# Patient Record
Sex: Female | Born: 1935 | Race: White | Hispanic: No | Smoking: Never smoker
Health system: Southern US, Community
[De-identification: ages and names within clinical notes are randomized; demographics above are authoritative.]

## PROBLEM LIST (undated history)

## (undated) DIAGNOSIS — N189 Chronic kidney disease, unspecified: Secondary | ICD-10-CM

## (undated) DIAGNOSIS — H3530 Unspecified macular degeneration: Secondary | ICD-10-CM

## (undated) DIAGNOSIS — R32 Unspecified urinary incontinence: Secondary | ICD-10-CM

## (undated) DIAGNOSIS — E119 Type 2 diabetes mellitus without complications: Secondary | ICD-10-CM

## (undated) DIAGNOSIS — E039 Hypothyroidism, unspecified: Secondary | ICD-10-CM

## (undated) DIAGNOSIS — E785 Hyperlipidemia, unspecified: Secondary | ICD-10-CM

## (undated) DIAGNOSIS — F0390 Unspecified dementia without behavioral disturbance: Secondary | ICD-10-CM

## (undated) DIAGNOSIS — H409 Unspecified glaucoma: Secondary | ICD-10-CM

## (undated) DIAGNOSIS — G4733 Obstructive sleep apnea (adult) (pediatric): Secondary | ICD-10-CM

## (undated) DIAGNOSIS — I10 Essential (primary) hypertension: Secondary | ICD-10-CM

---

## 2004-09-04 ENCOUNTER — Ambulatory Visit: Payer: Self-pay | Admitting: Internal Medicine

## 2004-09-25 ENCOUNTER — Ambulatory Visit: Payer: Self-pay | Admitting: Unknown Physician Specialty

## 2004-10-02 ENCOUNTER — Ambulatory Visit: Payer: Self-pay | Admitting: Unknown Physician Specialty

## 2005-10-01 ENCOUNTER — Ambulatory Visit: Payer: Self-pay | Admitting: Internal Medicine

## 2005-12-04 ENCOUNTER — Ambulatory Visit: Payer: Self-pay | Admitting: Internal Medicine

## 2006-01-14 ENCOUNTER — Ambulatory Visit: Payer: Self-pay | Admitting: Unknown Physician Specialty

## 2006-03-03 ENCOUNTER — Other Ambulatory Visit: Payer: Self-pay

## 2006-03-03 ENCOUNTER — Ambulatory Visit: Payer: Self-pay | Admitting: Unknown Physician Specialty

## 2006-03-06 ENCOUNTER — Inpatient Hospital Stay: Payer: Self-pay | Admitting: Unknown Physician Specialty

## 2006-04-16 ENCOUNTER — Ambulatory Visit: Payer: Self-pay | Admitting: Unknown Physician Specialty

## 2006-05-19 ENCOUNTER — Ambulatory Visit: Payer: Self-pay | Admitting: Unknown Physician Specialty

## 2006-05-22 ENCOUNTER — Ambulatory Visit: Payer: Self-pay | Admitting: Unknown Physician Specialty

## 2007-01-15 ENCOUNTER — Ambulatory Visit: Payer: Self-pay | Admitting: Internal Medicine

## 2008-03-09 ENCOUNTER — Ambulatory Visit: Payer: Self-pay | Admitting: Internal Medicine

## 2008-10-11 ENCOUNTER — Ambulatory Visit: Payer: Self-pay | Admitting: Gastroenterology

## 2008-11-22 ENCOUNTER — Ambulatory Visit: Payer: Self-pay | Admitting: Neurology

## 2008-11-25 ENCOUNTER — Encounter: Payer: Self-pay | Admitting: Neurology

## 2008-12-12 ENCOUNTER — Ambulatory Visit: Payer: Self-pay | Admitting: Neurology

## 2008-12-19 ENCOUNTER — Encounter: Payer: Self-pay | Admitting: Neurology

## 2009-01-12 ENCOUNTER — Ambulatory Visit: Payer: Self-pay | Admitting: Neurology

## 2009-01-16 ENCOUNTER — Encounter: Payer: Self-pay | Admitting: Neurology

## 2009-01-31 ENCOUNTER — Ambulatory Visit: Payer: Self-pay | Admitting: Ophthalmology

## 2009-02-13 ENCOUNTER — Ambulatory Visit: Payer: Self-pay | Admitting: Ophthalmology

## 2009-02-16 ENCOUNTER — Encounter: Payer: Self-pay | Admitting: Neurology

## 2009-03-18 ENCOUNTER — Encounter: Payer: Self-pay | Admitting: Neurology

## 2009-04-20 ENCOUNTER — Ambulatory Visit: Payer: Self-pay | Admitting: Internal Medicine

## 2009-05-02 ENCOUNTER — Ambulatory Visit: Payer: Self-pay | Admitting: Neurology

## 2009-07-14 ENCOUNTER — Ambulatory Visit: Payer: Self-pay | Admitting: Internal Medicine

## 2010-01-15 ENCOUNTER — Encounter: Payer: Self-pay | Admitting: Neurology

## 2010-01-16 ENCOUNTER — Encounter: Payer: Self-pay | Admitting: Neurology

## 2010-02-16 ENCOUNTER — Encounter: Payer: Self-pay | Admitting: Neurology

## 2010-03-18 ENCOUNTER — Encounter: Payer: Self-pay | Admitting: Neurology

## 2010-05-03 ENCOUNTER — Observation Stay: Payer: Self-pay | Admitting: Internal Medicine

## 2013-12-21 ENCOUNTER — Ambulatory Visit: Payer: Self-pay | Admitting: Family

## 2014-07-14 ENCOUNTER — Emergency Department: Payer: Self-pay | Admitting: Emergency Medicine

## 2015-06-24 ENCOUNTER — Emergency Department: Payer: Medicare (Managed Care)

## 2015-06-24 ENCOUNTER — Encounter: Payer: Self-pay | Admitting: Emergency Medicine

## 2015-06-24 ENCOUNTER — Emergency Department
Admission: EM | Admit: 2015-06-24 | Discharge: 2015-06-24 | Disposition: A | Discharge status: Home / Self Care | Payer: Medicare (Managed Care) | Attending: Emergency Medicine | Admitting: Emergency Medicine

## 2015-06-24 DIAGNOSIS — S0990XA Unspecified injury of head, initial encounter: Secondary | ICD-10-CM | POA: Diagnosis present

## 2015-06-24 DIAGNOSIS — N189 Chronic kidney disease, unspecified: Secondary | ICD-10-CM | POA: Diagnosis not present

## 2015-06-24 DIAGNOSIS — Y9301 Activity, walking, marching and hiking: Secondary | ICD-10-CM | POA: Diagnosis not present

## 2015-06-24 DIAGNOSIS — Z880 Allergy status to penicillin: Secondary | ICD-10-CM | POA: Diagnosis not present

## 2015-06-24 DIAGNOSIS — S0181XA Laceration without foreign body of other part of head, initial encounter: Secondary | ICD-10-CM | POA: Insufficient documentation

## 2015-06-24 DIAGNOSIS — E119 Type 2 diabetes mellitus without complications: Secondary | ICD-10-CM | POA: Insufficient documentation

## 2015-06-24 DIAGNOSIS — S0191XA Laceration without foreign body of unspecified part of head, initial encounter: Secondary | ICD-10-CM

## 2015-06-24 DIAGNOSIS — Y92128 Other place in nursing home as the place of occurrence of the external cause: Secondary | ICD-10-CM | POA: Diagnosis not present

## 2015-06-24 DIAGNOSIS — I129 Hypertensive chronic kidney disease with stage 1 through stage 4 chronic kidney disease, or unspecified chronic kidney disease: Secondary | ICD-10-CM | POA: Insufficient documentation

## 2015-06-24 DIAGNOSIS — W1839XA Other fall on same level, initial encounter: Secondary | ICD-10-CM | POA: Diagnosis not present

## 2015-06-24 DIAGNOSIS — Y998 Other external cause status: Secondary | ICD-10-CM | POA: Insufficient documentation

## 2015-06-24 HISTORY — DX: Chronic kidney disease, unspecified: N18.9

## 2015-06-24 HISTORY — DX: Unspecified macular degeneration: H35.30

## 2015-06-24 HISTORY — DX: Hyperlipidemia, unspecified: E78.5

## 2015-06-24 HISTORY — DX: Essential (primary) hypertension: I10

## 2015-06-24 HISTORY — DX: Unspecified glaucoma: H40.9

## 2015-06-24 HISTORY — DX: Hypothyroidism, unspecified: E03.9

## 2015-06-24 HISTORY — DX: Unspecified dementia, unspecified severity, without behavioral disturbance, psychotic disturbance, mood disturbance, and anxiety: F03.90

## 2015-06-24 HISTORY — DX: Obstructive sleep apnea (adult) (pediatric): G47.33

## 2015-06-24 HISTORY — DX: Type 2 diabetes mellitus without complications: E11.9

## 2015-06-24 HISTORY — DX: Unspecified urinary incontinence: R32

## 2015-06-24 MED ORDER — LIDOCAINE HCL (PF) 1 % IJ SOLN
2.0000 mL | Freq: Once | INTRAMUSCULAR | Status: AC
  Administered 2015-06-24: 2 mL via INTRADERMAL
  Filled 2015-06-24: qty 5

## 2015-06-24 NOTE — Discharge Instructions (Signed)
Head Injury You have a head injury. Headaches and throwing up (vomiting) are common after a head injury. It should be easy to wake up from sleeping. Sometimes you must stay in the hospital. Most problems happen within the first 24 hours. Side effects may occur up to 7-10 days after the injury.  WHAT ARE THE TYPES OF HEAD INJURIES? Head injuries can be as minor as a bump. Some head injuries can be more severe. More severe head injuries include:  A jarring injury to the brain (concussion).  A bruise of the brain (contusion). This mean there is bleeding in the brain that can cause swelling.  A cracked skull (skull fracture).  Bleeding in the brain that collects, clots, and forms a bump (hematoma). WHEN SHOULD I GET HELP RIGHT AWAY?   You are confused or sleepy.  You cannot be woken up.  You feel sick to your stomach (nauseous) or keep throwing up (vomiting).  Your dizziness or unsteadiness is getting worse.  You have very bad, lasting headaches that are not helped by medicine. Take medicines only as told by your doctor.  You cannot use your arms or legs like normal.  You cannot walk.  You notice changes in the black spots in the center of the colored part of your eye (pupil).  You have clear or bloody fluid coming from your nose or ears.  You have trouble seeing. During the next 24 hours after the injury, you must stay with someone who can watch you. This person should get help right away (call 911 in the [place] if you start to shake and are not able to control it (have seizures), you pass out, or you are unable to wake up. HOW CAN I PREVENT A HEAD INJURY IN THE FUTURE?  Wear seat belts.  Wear a helmet while bike riding and playing sports like football.  Stay away from dangerous activities around the house. WHEN CAN I RETURN TO NORMAL ACTIVITIES AND ATHLETICS? See your doctor before doing these activities. You should not do normal activities or play contact sports until 1 week  after the following symptoms have stopped:  Headache that does not go away.  Dizziness.  Poor attention.  Confusion.  Memory problems.  Sickness to your stomach or throwing up.  Tiredness.  Fussiness.  Bothered by bright lights or loud noises.  Anxiousness or depression.  Restless sleep. MAKE SURE YOU:   Understand these instructions.  Will watch your condition.  Will get help right away if you are not doing well or get worse. Document Released: 10/17/2008 Document Revised: 03/21/2014 Document Reviewed: 07/12/2013 [facility] Patient Information 2015 [place], [place]. This information is not intended to replace advice given to you by your health care provider. Make sure you discuss any questions you have with your health care provider.  Sutured Wound Care Sutures are stitches that can be used to close wounds. Wound care helps prevent pain and infection.  HOME CARE INSTRUCTIONS   Rest and elevate the injured area until all the pain and swelling are gone.  Only take over-the-counter or prescription medicines for pain, discomfort, or fever as directed by your caregiver.  After 48 hours, gently wash the area with mild soap and water once a day, or as directed. Rinse off the soap. Pat the area dry with a clean towel. Do not rub the wound. This may cause bleeding.  Follow your caregiver's instructions for how often to change the bandage (dressing). Stop using a dressing after 2 days or  after the wound stops draining.  If the dressing sticks, moisten it with soapy water and gently remove it.  Apply ointment on the wound as directed.  Avoid stretching a sutured wound.  Drink enough fluids to keep your urine clear or pale yellow.  Follow up with your caregiver for suture removal as directed.  Use sunscreen on your wound for the next 3 to 6 months so the scar will not darken. SEEK IMMEDIATE MEDICAL CARE IF:   Your wound becomes red, swollen, hot, or tender.  You have  increasing pain in the wound.  You have a red streak that extends from the wound.  There is pus coming from the wound.  You have a fever.  You have shaking chills.  There is a bad smell coming from the wound.  You have persistent bleeding from the wound. MAKE SURE YOU:   Understand these instructions.  Will watch your condition.  Will get help right away if you are not doing well or get worse. Document Released: 12/12/2004 Document Revised: 01/27/2012 Document Reviewed: 03/10/2011 [facility] Patient Information 2015 [place], [place]. This information is not intended to replace advice given to you by your health care provider. Make sure you discuss any questions you have with your health care provider.

## 2015-06-24 NOTE — ED Notes (Signed)
Discharge instructions given to Daughter Lelon Perla.  Patient taken back to [facility] by daughter via personal vehicle.

## 2015-06-24 NOTE — ED Provider Notes (Signed)
[facility] Emergency Department Provider Note  ____________________________________________  Time seen: ----------------------------------------- 3:03 PM on 06/24/2015 -----------------------------------------    I have reviewed the triage vital signs and the nursing notes.   HISTORY  Chief Complaint Fall   HPI Cassandra Fuentes is a 81 y.o. female who had a non-syncopal fall today. Patient was apparently ambulating in the hallway and fell forward after losing her balance. She states that she's having some left hand pain but otherwise no physical complaints. She does have family history of dementia and seems to be at her baseline mental status she denies any neck thoracic or lumbar spine pain. She describes some mild pain in her left hand. He denies any lower extremity or pelvic discomfort. According to the nursing staff that her last tetanus shot is April 2012.    Past Medical History  Diagnosis Date  . Diabetes mellitus without complication   . Hypertension   . Hypothyroidism   . Hypothyroidism   . Urinary incontinence   . Obstructive sleep apnea   . Chronic kidney disease   . Macular degeneration   . Glaucoma   . Hyperlipidemia   . Dementia     There are no active problems to display for this patient.   History reviewed. No pertinent past surgical history.  No current outpatient prescriptions on file.  Allergies Penicillins; Telepaque; and Morphine and related  History reviewed. No pertinent family history.  Social History History  Substance Use Topics  . Smoking status: Never Smoker   . Smokeless tobacco: Not on file  . Alcohol Use: No    Review of Systems  Patient denies any other musculoskeletal complaints of left hand pain. Patient denies any near-syncope symptoms. She denies any chest pain or shortness of breath. She denies any abdominal pain   ____________________________________________   PHYSICAL EXAM:  VITAL  SIGNS: ED Triage Vitals  Enc Vitals Group     BP 06/24/15 1430 121/56 mmHg     Pulse Rate 06/24/15 1430 65     Resp 06/24/15 1435 16     Temp 06/24/15 1435 97.6 F (36.4 C)     Temp Source 06/24/15 1435 Oral     SpO2 06/24/15 1429 92 %     Weight 06/24/15 1435 175 lb (79.379 kg)     Height 06/24/15 1435  (1.676 m)     Head Cir --      Peak Flow --      Pain Score --      Pain Loc --      Pain Edu? --      Excl. in GC? --      Constitutional: Alert and oriented. Well appearing and in no distress. Eyes: Conjunctivae are normal.  ENT   Head: Patient has a 3 cm laceration above the left eyebrow. Palpation here shows no crepitus or step-off noted and appears to be in the skin and slightly into subcutaneous area. No foreign bodies present.   Mouth/Throat: Mucous membranes are moist. Neck: No central C-spine tenderness with good flexion extension rotation of pain or neuropraxia Cardiovascular: Normal rate, regular rhythm. Normal and symmetric distal pulses are present in all extremities. No murmurs, rubs, or gallops. Respiratory: Normal respiratory effort without tachypnea nor retractions. Breath sounds are clear and equal bilaterally.  Gastrointestinal: Soft and non-tender in all quadrants. No distention. There is no CVA tenderness. Genitourinary: deferred Musculoskeletal: Nontender with normal range of motion in all extremities. No lower extremity tenderness nor edema. Close  examination of the left hand shows no musculoskeletal abnormalities. Normal alignment of the digits with no swelling or ecchymosis. Mild skin tear on the lateral surface of her left wrist non-hemorrhagic. Neurologic:  Normal speech and language. No gross focal neurologic deficits are appreciated. Skin:  No contusions or abrasions over one mentioned above. Psychiatric: Mood and affect are normal. Patient exhibits appropriate insight and judgment.  ____________________________________________    LABS  (pertinent positives/negatives)  Labs Reviewed - No data to display  ____________________________________________   EKG none    ____________________________________________    RADIOLOGY I have personally reviewed any xrays that were ordered on this patient:   _____________________   Anice Paganini #[id number] (81 y.o. F) ED15A        Lab Results    None    Imaging Results       DG Hand 2 View Left (Final result) Result time: 06/24/15 16:26:49   Final result by Rad Results In Interface (06/24/15 16:26:49)   Narrative:   CLINICAL DATA: Patient status post fall. Left hand pain. Dementia. Initial encounter.  EXAM: LEFT HAND - 2 VIEW  COMPARISON: None.  FINDINGS: Only an AP and oblique view of the hand was submitted, limiting evaluation. [facility] joint degenerative changes. DIP joint degenerative changes particularly within the middle finger. No evidence for acute displaced fracture. Regional soft tissues unremarkable.  IMPRESSION: Limited exam as only an AP and oblique view were obtained.  No definite displaced fracture.   Electronically Signed By: Annia Belt M.D. On: 06/24/2015 16:26          CT Head Wo Contrast (Final result) Result time: 06/24/15 15:07:35   Final result by Rad Results In Interface (06/24/15 15:07:35)   Narrative:   CLINICAL DATA: Patient tripped and fell from standing position at nursing home, hit head, no loss of consciousness, laceration outer left thigh  EXAM: CT HEAD WITHOUT CONTRAST  TECHNIQUE: Contiguous axial images were obtained from the base of the skull through the vertex without intravenous contrast.  COMPARISON: 07/14/2014  FINDINGS: Severe atrophy. No intracranial hemorrhage or fluid. No evidence of infarct or mass. No skull fracture. Small chronic lacunar infarct left pons stable. Or prominent extra-axial space over the right vertex than on the left, stable, with low-attenuation.  Possibility of chronic hygroma not excluded, but no interval change. Laceration over left orbit noted.  IMPRESSION: Severe chronic involutional change with no acute intracranial abnormalities   Electronically Signed By: Esperanza Heir M.D. On: 06/24/2015 15:07        ECG Results    None     _______________________  IMPRESSION:  Left hand x-ray Head CT  Severe chronic involutional change with no acute intracranial abnormalities   PROCEDURES  Procedure(s) performed: LACERATION REPAIR Performed by: Jennye Moccasin Authorized by: Jennye Moccasin Consent: Verbal consent obtained. Risks and benefits: risks, benefits and alternatives were discussed Consent given by: patient Patient identity confirmed: provided demographic data Prepped and Draped in normal sterile fashion Wound explored  Laceration Location: Left frontal  face  Laceration Length: 3 cm non-complicated  No Foreign Bodies seen or palpated  Anesthesia: local infiltration  Local anesthetic: lidocaine 1 % without epinephrine  Anesthetic total: 4 ml  Irrigation method: syringe Amount of cleaning: standard  Skin closure: Single-layer   Number of sutures: 4  Technique: Interrupted   Patient tolerance: Patient tolerated the procedure well with no immediate complications.    ____________________________________________   INITIAL IMPRESSION / ASSESSMENT AND PLAN / ED COURSE  Pertinent  labs & imaging results that were available during my care of the patient were reviewed by me and considered in my medical decision making (see chart for details).  Patient had a non-syncopal fall and had a negative head CT and x-rays of the left hand. Patient's stay here was otherwise uneventful. Her wound on her head was cleaned and dressed by the nursing staff. To his recurrent 5-7 days.  ____________________________________________   FINAL CLINICAL IMPRESSION(S) / ED DIAGNOSES  Assessment: Facial  laceration Suture repair Left hand contusion Status post non-syncopal fall  Plan: Discharge back to facility. Patient can go by private vehicle Final diagnoses:  None     Jennye Moccasin, MD 06/24/15 1650

## 2015-06-24 NOTE — ED Notes (Signed)
Patient in via EMS for fall at [facility].  Patient walking with walker, tripped and  fell from standing position.  Staff report patient fell face first and hit head. Staff denies LOC.

## 2017-07-07 ENCOUNTER — Emergency Department: Payer: Medicare (Managed Care)

## 2017-07-07 ENCOUNTER — Inpatient Hospital Stay
Admission: EM | Admit: 2017-07-07 | Discharge: 2017-07-12 | DRG: 064 | Disposition: A | Discharge status: Hospice | Payer: Medicare (Managed Care) | Attending: Internal Medicine | Admitting: Internal Medicine

## 2017-07-07 DIAGNOSIS — G92 Toxic encephalopathy: Secondary | ICD-10-CM | POA: Diagnosis present

## 2017-07-07 DIAGNOSIS — R4182 Altered mental status, unspecified: Secondary | ICD-10-CM | POA: Diagnosis present

## 2017-07-07 DIAGNOSIS — Z7189 Other specified counseling: Secondary | ICD-10-CM

## 2017-07-07 DIAGNOSIS — E039 Hypothyroidism, unspecified: Secondary | ICD-10-CM | POA: Diagnosis present

## 2017-07-07 DIAGNOSIS — L89892 Pressure ulcer of other site, stage 2: Secondary | ICD-10-CM | POA: Diagnosis present

## 2017-07-07 DIAGNOSIS — N3000 Acute cystitis without hematuria: Secondary | ICD-10-CM | POA: Diagnosis not present

## 2017-07-07 DIAGNOSIS — B9620 Unspecified Escherichia coli [E. coli] as the cause of diseases classified elsewhere: Secondary | ICD-10-CM | POA: Diagnosis present

## 2017-07-07 DIAGNOSIS — K56609 Unspecified intestinal obstruction, unspecified as to partial versus complete obstruction: Secondary | ICD-10-CM

## 2017-07-07 DIAGNOSIS — W19XXXA Unspecified fall, initial encounter: Secondary | ICD-10-CM | POA: Diagnosis present

## 2017-07-07 DIAGNOSIS — R1310 Dysphagia, unspecified: Secondary | ICD-10-CM | POA: Diagnosis present

## 2017-07-07 DIAGNOSIS — Z515 Encounter for palliative care: Secondary | ICD-10-CM

## 2017-07-07 DIAGNOSIS — E785 Hyperlipidemia, unspecified: Secondary | ICD-10-CM | POA: Diagnosis present

## 2017-07-07 DIAGNOSIS — I639 Cerebral infarction, unspecified: Secondary | ICD-10-CM | POA: Diagnosis not present

## 2017-07-07 DIAGNOSIS — H409 Unspecified glaucoma: Secondary | ICD-10-CM | POA: Diagnosis present

## 2017-07-07 DIAGNOSIS — G4733 Obstructive sleep apnea (adult) (pediatric): Secondary | ICD-10-CM | POA: Diagnosis present

## 2017-07-07 DIAGNOSIS — Z91041 Radiographic dye allergy status: Secondary | ICD-10-CM

## 2017-07-07 DIAGNOSIS — G309 Alzheimer's disease, unspecified: Secondary | ICD-10-CM | POA: Diagnosis present

## 2017-07-07 DIAGNOSIS — H3530 Unspecified macular degeneration: Secondary | ICD-10-CM | POA: Diagnosis present

## 2017-07-07 DIAGNOSIS — E1122 Type 2 diabetes mellitus with diabetic chronic kidney disease: Secondary | ICD-10-CM | POA: Diagnosis present

## 2017-07-07 DIAGNOSIS — Z79899 Other long term (current) drug therapy: Secondary | ICD-10-CM

## 2017-07-07 DIAGNOSIS — Z1623 Resistance to quinolones and fluoroquinolones: Secondary | ICD-10-CM | POA: Diagnosis present

## 2017-07-07 DIAGNOSIS — Z885 Allergy status to narcotic agent status: Secondary | ICD-10-CM

## 2017-07-07 DIAGNOSIS — I129 Hypertensive chronic kidney disease with stage 1 through stage 4 chronic kidney disease, or unspecified chronic kidney disease: Secondary | ICD-10-CM | POA: Diagnosis present

## 2017-07-07 DIAGNOSIS — N183 Chronic kidney disease, stage 3 (moderate): Secondary | ICD-10-CM | POA: Diagnosis present

## 2017-07-07 DIAGNOSIS — Z1612 Extended spectrum beta lactamase (ESBL) resistance: Secondary | ICD-10-CM | POA: Diagnosis present

## 2017-07-07 DIAGNOSIS — F0280 Dementia in other diseases classified elsewhere without behavioral disturbance: Secondary | ICD-10-CM | POA: Diagnosis present

## 2017-07-07 DIAGNOSIS — R29725 NIHSS score 25: Secondary | ICD-10-CM | POA: Diagnosis present

## 2017-07-07 DIAGNOSIS — Z66 Do not resuscitate: Secondary | ICD-10-CM | POA: Diagnosis present

## 2017-07-07 DIAGNOSIS — Z880 Allergy status to penicillin: Secondary | ICD-10-CM

## 2017-07-07 DIAGNOSIS — F329 Major depressive disorder, single episode, unspecified: Secondary | ICD-10-CM | POA: Diagnosis present

## 2017-07-07 LAB — CBC WITH DIFFERENTIAL/PLATELET
Basophils Absolute: 0.1 10*3/uL (ref 0–0.1)
Basophils Relative: 1 %
EOS ABS: 0.3 10*3/uL (ref 0–0.7)
EOS PCT: 2 %
HCT: 37.5 % (ref 35.0–47.0)
Hemoglobin: 12.4 g/dL (ref 12.0–16.0)
LYMPHS ABS: 2.5 10*3/uL (ref 1.0–3.6)
LYMPHS PCT: 22 %
MCH: 29.5 pg (ref 26.0–34.0)
MCHC: 33 g/dL (ref 32.0–36.0)
MCV: 89.4 fL (ref 80.0–100.0)
MONO ABS: 1.5 10*3/uL — AB (ref 0.2–0.9)
MONOS PCT: 14 %
Neutro Abs: 6.7 10*3/uL — ABNORMAL HIGH (ref 1.4–6.5)
Neutrophils Relative %: 61 %
PLATELETS: 355 10*3/uL (ref 150–440)
RBC: 4.2 MIL/uL (ref 3.80–5.20)
RDW: 12.9 % (ref 11.5–14.5)
WBC: 11.1 10*3/uL — AB (ref 3.6–11.0)

## 2017-07-07 LAB — URINALYSIS, COMPLETE (UACMP) WITH MICROSCOPIC
BILIRUBIN URINE: NEGATIVE
GLUCOSE, UA: NEGATIVE mg/dL
HGB URINE DIPSTICK: NEGATIVE
Ketones, ur: NEGATIVE mg/dL
NITRITE: POSITIVE — AB
Protein, ur: NEGATIVE mg/dL
SPECIFIC GRAVITY, URINE: 1.019 (ref 1.005–1.030)
pH: 6 (ref 5.0–8.0)

## 2017-07-07 LAB — COMPREHENSIVE METABOLIC PANEL
ALK PHOS: 78 U/L (ref 38–126)
ALT: 13 U/L — AB (ref 14–54)
AST: 20 U/L (ref 15–41)
Albumin: 3.6 g/dL (ref 3.5–5.0)
Anion gap: 10 (ref 5–15)
BUN: 18 mg/dL (ref 6–20)
CALCIUM: 9.2 mg/dL (ref 8.9–10.3)
CHLORIDE: 100 mmol/L — AB (ref 101–111)
CO2: 31 mmol/L (ref 22–32)
CREATININE: 0.77 mg/dL (ref 0.44–1.00)
GFR calc non Af Amer: >60 mL/min (ref >60)
Glucose, Bld: 179 mg/dL — ABNORMAL HIGH (ref 65–99)
Potassium: 3.6 mmol/L (ref 3.5–5.1)
SODIUM: 141 mmol/L (ref 135–145)
Total Bilirubin: 0.5 mg/dL (ref 0.3–1.2)
Total Protein: 7.2 g/dL (ref 6.5–8.1)

## 2017-07-07 LAB — APTT: aPTT: 28 seconds (ref 24–36)

## 2017-07-07 LAB — TROPONIN I

## 2017-07-07 MED ORDER — SODIUM CHLORIDE 0.9 % IV BOLUS (SEPSIS): 1000.0000 mL | Freq: Once | INTRAVENOUS | Status: DC

## 2017-07-07 NOTE — ED Triage Notes (Signed)
Pt arrives to ED via [facility] from [facility] for excessive drooling. Doctor at [facility] told EMS stroke scale negative. Pt is able to answer yes/no questions. Speech is garbled. Able to move arms but not legs. Smiles, raises eyes. Pupils equil and reactive.

## 2017-07-07 NOTE — ED Provider Notes (Signed)
[facility] Emergency Department Provider Note  ____________________________________________   8:31 PM  I have reviewed the triage vital signs and the nursing notes.   HISTORY  Chief Complaint Drooling   HPI Cassandra Fuentes is a 83 y.o. female who presents to the emergency department for evaluationof excessive drooling. Patient was last observed to be normal during lunch around 12:00 this afternoon. Staff at [facility] then noted her to have garbled speech which is not her baseline. Daughter states that she fell about 10 days ago and was evaluated by the Medical Director who took x-rays of her left leg and hip that were negative for fracture. Daughter questions sciatica since the fall and she has had difficulty getting up and down and getting on and off the toilet and has been less willing to ambulate with her walker.    Past Medical History:  Diagnosis Date  . Chronic kidney disease   . Dementia   . Diabetes mellitus without complication (HCC)   . Glaucoma   . Hyperlipidemia   . Hypertension   . Hypothyroidism   . Hypothyroidism   . Macular degeneration   . Obstructive sleep apnea   . Urinary incontinence     There are no active problems to display for this patient.   History reviewed. No pertinent surgical history.  Prior to Admission medications   Medication Sig Start Date End Date Taking? Authorizing Provider  acetaminophen (TYLENOL) 325 MG tablet Take 325 mg by mouth every Monday, Wednesday, and Friday. Give at [facility] on center days at 12p.   Yes [provider]  acetaminophen (TYLENOL) 500 MG tablet Take 500 mg by mouth every 4 (four) hours as needed for mild pain, fever or headache.   Yes [provider]  acetaminophen (TYLENOL) 650 MG CR tablet Take 1,300 mg by mouth 2 (two) times daily.   Yes [provider]  Alum & Mag Hydroxide-Simeth (GERI-LANTA PO) Take 30 mLs by mouth every 6 (six) hours as needed (for  heartburn/indigestion). Not to exceed 4 doses in 24 Hours.   Yes [provider]  brimonidine (ALPHAGAN) 0.15 % ophthalmic solution Place 1 drop into both eyes 2 (two) times daily.   Yes [provider]  Calcium Carbonate-Vitamin D (CALCIUM 600+D) 600-400 MG-UNIT per tablet Take 1 tablet by mouth every morning.   Yes [provider]  clopidogrel (PLAVIX) 75 MG tablet Take 75 mg by mouth daily.   Yes [provider]  enalapril (VASOTEC) 20 MG tablet Take 20 mg by mouth 2 (two) times daily.   Yes [provider]  guaiFENesin (Q-TUSSIN) 100 MG/5ML liquid Take 200 mg by mouth every 6 (six) hours as needed for cough. Not to exceed 4 doses in 24 hours.   Yes [provider]  levothyroxine (SYNTHROID, LEVOTHROID) 150 MCG tablet Take 150 mcg by mouth daily before breakfast.   Yes [provider]  loperamide (IMODIUM) 2 MG capsule Take 2 mg by mouth every 3 (three) hours as needed for diarrhea or loose stools. Not to exceed 8 doses in 24 hours.   Yes [provider]  magnesium hydroxide (MILK OF MAGNESIA) 400 MG/5ML suspension Take 30 mLs by mouth at bedtime as needed for mild constipation or moderate constipation.   Yes [provider]  Melatonin 5 MG TABS Take 5 mg by mouth at bedtime.   Yes [provider]  mometasone (NASONEX) 50 MCG/ACT nasal spray Place 1 spray into the nose daily.  Yes [provider]  multivitamin-lutein (OCUVITE-LUTEIN) CAPS capsule Take 1 capsule by mouth daily.   Yes [provider]  PARoxetine (PAXIL) 10 MG tablet Take 10 mg by mouth daily.   Yes [provider]  simvastatin (ZOCOR) 40 MG tablet Take 40 mg by mouth every evening.   Yes [provider]  Trolamine Salicylate (ASPERCREME) 10 % LOTN Apply 1 application topically 2 (two) times daily. Apply to right shoulder and right knee.   Yes [provider]    Allergies Penicillins; Telepaque  [iopanoic acid]; and Morphine and related  History reviewed. No pertinent family history.  Social History Social History  Substance Use Topics  . Smoking status: Never Smoker  . Smokeless tobacco: Not on file  . Alcohol use No    Review of Systems impossible due to patient's level of dementia and current illness. ____________________________________________   PHYSICAL EXAM:  VITAL SIGNS: ED Triage Vitals [07/07/17 1934]  Enc Vitals Group     BP 126/85     Pulse Rate 62     Resp (!) 22     Temp      Temp src      SpO2 100 %     Weight      Height      Head Circumference      Peak Flow      Pain Score 0     Pain Loc      Pain Edu?      Excl. in GC?     Constitutional: Alert and oriented. Well appearing and in no acute distress. Eyes: Conjunctivae are normal. PERRL. Pupils 2+ and sluggish Head: Atraumatic. Nose: No congestion/rhinnorhea. Mouth/Throat: Mucous membranes are moist.  Oropharynx non-erythematous. Neck: No stridor. No cervical spine tenderness to palpation. Cardiovascular: Normal rate, regular rhythm. Grossly normal heart sounds.  Good peripheral circulation. Respiratory: Normal respiratory effort.  No retractions. Lungs CTAB. Gastrointestinal: Soft and nontender. No distention. No abdominal bruits. No CVA tenderness. Musculoskeletal: No lower extremity tenderness nor edema.  No joint effusions. Neurologic:  Normal speech and language. No gross focal neurologic deficits are appreciated. No gait instability. Skin:  Skin is warm, dry and intact. No rash noted. Psychiatric: Mood and affect are normal. Speech and behavior are normal.  ____________________________________________   LABS (all labs ordered are listed, but only abnormal results are displayed)  Labs Reviewed  URINALYSIS, COMPLETE (UACMP) WITH MICROSCOPIC - Abnormal; Notable for the following:       Result Value   Color, Urine YELLOW (*)    APPearance HAZY (*)    Nitrite POSITIVE (*)     Leukocytes, UA MODERATE (*)    Bacteria, UA MANY (*)    Squamous Epithelial / LPF 6-30 (*)    All other components within normal limits  COMPREHENSIVE METABOLIC PANEL - Abnormal; Notable for the following:    Chloride 100 (*)    Glucose, Bld 179 (*)    ALT 13 (*)    All other components within normal limits  CBC WITH DIFFERENTIAL/PLATELET - Abnormal; Notable for the following:    WBC 11.1 (*)    Neutro Abs 6.7 (*)    Monocytes Absolute 1.5 (*)    All other components within normal limits  URINE CULTURE  APTT  TROPONIN I  CBG MONITORING, ED   ____________________________________________  EKG   Sinus tachycardia with a rate of 113. Occasional PVCs. Normal PR and QT intervals. Right axis deviation. ____________________________________________  RADIOLOGY  Ct Head Code Stroke  Wo Contrast  Result Date: 07/07/2017 CLINICAL DATA:  Code stroke. 83 y/o F; garbled speech. Unable to move legs. EXAM: CT HEAD WITHOUT CONTRAST TECHNIQUE: Contiguous axial images were obtained from the base of the skull through the vertex without intravenous contrast. COMPARISON:  06/24/2015 CT head FINDINGS: Brain: No evidence of acute infarction, hemorrhage, hydrocephalus, extra-axial collection or mass lesion/mass effect. Stable advanced chronic microvascular ischemic changes and parenchymal volume loss of the brain. Stable chronic lacunar infarcts within left caudate head, left lentiform nucleus, and left hemi pons. Vascular: Severe calcific atherosclerosis of carotid siphons and calcified plaques of the M1 segments. 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 Skull: Normal. Negative for fracture or focal lesion.The Sinuses/Orbits: No acute finding. Other: None. ASPECTS [facility] Stroke Program Early CT Score) - Ganglionic level infarction (caudate, lentiform nuclei, internal capsule, insula, M1-M3 cortex): 7 - Supraganglionic infarction (M4-M6 cortex): 3 Total score (0-10 with 10 being normal): 10 IMPRESSION: 1. No  acute intracranial abnormality identified. 2. Stable advanced chronic microvascular ischemic changes and parenchymal volume loss of the brain. 3. ASPECTS is 10 These results were called by telephone at the time of interpretation on 07/07/2017 at 8:11 pm to Dr. Roxan Hockey, who verbally acknowledged these results. Electronically Signed   By: Mitzi Hansen M.D.   On: 07/07/2017 20:12    ____________________________________________   PROCEDURES  Procedure(s) performed: None  Procedures  Critical Care performed: No  ____________________________________________   INITIAL IMPRESSION / ASSESSMENT AND PLAN / ED COURSE  Pertinent labs & imaging results that were available during my care of the patient were reviewed by me and considered in my medical decision making (see chart for details).  83 year old female presenting to the emergency department for evaluation after staff at [facility] house and noticed that she was drooling and that her speech was slurred.   ----------------------------------------- 8:31 PM on 07/07/2017 -----------------------------------------  Daughter updated on labs and CT scan. Awaiting remainder of lab results and urinalysis.  ----------------------------------------- 11:08 PM on 07/07/2017 -----------------------------------------  Patient unable to complete the stroke swallow screen. No improvement in symptoms, however no worsening or focal change on repeat neurological examination. Stroke scoring systems are of no help due to patient's baseline dementia as well as fall 10 days ago that has left her unable to ambulate even with her walker. She has been very resistant to any degree of using her left leg despite reportedly negative images performed after her fall. Plan of admission was discussed with patient's family to agree to have her stay for further evaluation. __________________________________________   FINAL CLINICAL IMPRESSION(S) / ED  DIAGNOSES  Final diagnoses:  Altered mental status, unspecified altered mental status type  Acute cystitis without hematuria      NEW MEDICATIONS STARTED DURING THIS VISIT:  New Prescriptions   No medications on file     Note:  This document was prepared using Dragon voice recognition software and may include unintentional dictation errors.    Chinita Pester, FNP 07/07/17 2311    Sharman Cheek, MD 07/07/17 [id number]

## 2017-07-07 NOTE — ED Notes (Signed)
Transported patient to CT.

## 2017-07-07 NOTE — H&P (Signed)
History and Physical   [facility] - [place] @ [facility] Admission History and Physical [employer], D.O.    Patient Name: Cassandra Fuentes MR#: [id number] Date of Birth: 1929-06-12 Date of Admission: 07/07/2017  Referring MD/NP/PA: NP Kem [place]s Primary Care Physician: [facility], [facility]  Chief Complaint:  Chief Complaint  Patient presents with  . Drooling  Please note the entire history is obtained from the patient's emergency department chart, emergency department provider and the patient's family who is at the bedside. Patient's personal history is limited by dementia and altered mental status.   HPI: Cassandra Fuentes is a 83 y.o. female with a known history of CK D, dementia, diabetes, hypertension, hyperlipidemia, hypothyroidism, obstructive sleep apnea presents to the emergency department for evaluation of altered mental status.  Patient was in a usual state of health until this afternoon when she was noted to have altered mental status with some drooling and slurred speech at her living facility..  Of note patient had a fall 10 days ago and has been unable to ambulate since then even with her walker.   EMS/ED Course: Patient received normal saline. Medical admission has been requested for further management of altered mental status, questionable CVA and urinary tract infection..  Review of Systems:  Unable to obtain secondary to dementia and altered mental status   Past Medical History:  Diagnosis Date  . Chronic kidney disease   . Dementia   . Diabetes mellitus without complication (HCC)   . Glaucoma   . Hyperlipidemia   . Hypertension   . Hypothyroidism   . Hypothyroidism   . Macular degeneration   . Obstructive sleep apnea   . Urinary incontinence     History reviewed. No pertinent surgical history.   reports that she has never smoked. She does not have any smokeless tobacco history on file. She reports that she does not drink alcohol or  use drugs.  Allergies  Allergen Reactions  . Penicillins Hives  . Telepaque [Iopanoic Acid] Other (See Comments)    Unknown reaction  . Morphine And Related Nausea And Vomiting    History reviewed. No pertinent family history.  Prior to Admission medications   Medication Sig Start Date End Date Taking? Authorizing Provider  acetaminophen (TYLENOL) 325 MG tablet Take 325 mg by mouth every Monday, Wednesday, and Friday. Give at [facility] on center days at 12p.    [provider]  acetaminophen (TYLENOL) 500 MG tablet Take 500 mg by mouth every 4 (four) hours as needed for mild pain, fever or headache.    [provider]  acetaminophen (TYLENOL) 650 MG CR tablet Take 1,300 mg by mouth 2 (two) times daily.    [provider]  Alum & Mag Hydroxide-Simeth (GERI-LANTA PO) Take 30 mLs by mouth every 6 (six) hours as needed (for heartburn/indigestion). Not to exceed 4 doses in 24 Hours.    [provider]  bimatoprost (LUMIGAN) 0.01 % SOLN Place 1 drop into both eyes at bedtime.    [provider]  brimonidine (ALPHAGAN) 0.15 % ophthalmic solution Place 1 drop into both eyes 2 (two) times daily.    [provider]  Calcium Carbonate-Vitamin D (CALCIUM 600+D) 600-400 MG-UNIT per tablet Take 1 tablet by mouth every morning.    [provider]  clopidogrel (PLAVIX) 75 MG tablet Take 75 mg by mouth daily.    [provider]  enalapril (VASOTEC) 20 MG tablet Take 20 mg by mouth 2 (two) times daily.  [provider]  guaiFENesin (Q-TUSSIN) 100 MG/5ML liquid Take 200 mg by mouth every 6 (six) hours as needed for cough. Not to exceed 4 doses in 24 hours.    [provider]  levothyroxine (SYNTHROID, LEVOTHROID) 150 MCG tablet Take 150 mcg by mouth daily before breakfast.    [provider]  loperamide (IMODIUM) 2 MG capsule Take 2 mg by mouth every 3 (three) hours as needed for diarrhea or loose stools. Not to  exceed 8 doses in 24 hours.    [provider]  magnesium hydroxide (MILK OF MAGNESIA) 400 MG/5ML suspension Take 30 mLs by mouth at bedtime as needed for mild constipation or moderate constipation.    [provider]  Melatonin 5 MG TABS Take 5 mg by mouth at bedtime.    [provider]  mometasone (NASONEX) 50 MCG/ACT nasal spray Place 1 spray into the nose daily.    [provider]  multivitamin-lutein (OCUVITE-LUTEIN) CAPS capsule Take 1 capsule by mouth daily.    [provider]  PARoxetine (PAXIL) 10 MG tablet Take 10 mg by mouth daily.    [provider]  simvastatin (ZOCOR) 40 MG tablet Take 40 mg by mouth every evening.    [provider]  Trolamine Salicylate (ASPERCREME) 10 % LOTN Apply 1 application topically 2 (two) times daily. Apply to right shoulder and right knee.    [provider]    Physical Exam: Vitals:   07/07/17 1930 07/07/17 1934 07/07/17 2133 07/07/17 2134  BP: 126/85 126/85 (!) 142/52   Pulse: (!) 56 62 (!) 58 (!) 38  Resp: 13 (!) 22 13 13   SpO2: 93% 100% (!) 87% 96%    GENERAL: 83 y.o.-year-old elderly female patient, chronically ill appearing.  HEENT: Head atraumatic, normocephalic. Pupils equal. Mucus membranes moist. Drooling.  NECK: Supple, full range of motion. No JVD, no bruit heard. No thyroid enlargement, no tenderness, no cervical lymphadenopathy. CHEST: Normal breath sounds bilaterally. No wheezing, rales, rhonchi or crackles. No use of accessory muscles of respiration.  No reproducible chest wall tenderness.  CARDIOVASCULAR: S1, S2 normal. No murmurs, rubs, or gallops. Cap refill <2 seconds. Pulses intact distally.  ABDOMEN: Soft, nondistended, nontender. No rebound, guarding, rigidity. Normoactive bowel sounds present in all four quadrants.  EXTREMITIES: No pedal edema, cyanosis, or clubbing. No calf tenderness or Homan's sign.  NEUROLOGIC: The patient is awake but not alert, does  not interact, respond or follow commands.  PSYCHIATRIC:  Normal affect, mood, thought content. SKIN: Warm, dry, and intact without obvious rash, lesion, or ulcer.    Labs on Admission:  CBC:  Recent Labs Lab 07/07/17 1932  WBC 11.1*  NEUTROABS 6.7*  HGB 12.4  HCT 37.5  MCV 89.4  PLT 355   Basic Metabolic Panel:  Recent Labs Lab 07/07/17 1932  NA 141  K 3.6  CL 100*  CO2 31  GLUCOSE 179*  BUN 18  CREATININE 0.77  CALCIUM 9.2   GFR: CrCl cannot be calculated (Unknown ideal weight.). Liver Function Tests:  Recent Labs Lab 07/07/17 1932  AST 20  ALT 13*  ALKPHOS 78  BILITOT 0.5  PROT 7.2  ALBUMIN 3.6   No results for input(s): LIPASE, AMYLASE in the last 168 hours. No results for input(s): AMMONIA in the last 168 hours. Coagulation Profile: No results for input(s): INR, PROTIME in the last 168 hours. Cardiac Enzymes:  Recent Labs Lab 07/07/17 1932  TROPONINI <0.03   BNP (last 3 results) No results  for input(s): PROBNP in the last 8760 hours. HbA1C: No results for input(s): HGBA1C in the last 72 hours. CBG: No results for input(s): GLUCAP in the last 168 hours. Lipid Profile: No results for input(s): CHOL, HDL, LDLCALC, TRIG, CHOLHDL, LDLDIRECT in the last 72 hours. Thyroid Function Tests: No results for input(s): TSH, T4TOTAL, FREET4, T3FREE, THYROIDAB in the last 72 hours. Anemia Panel: No results for input(s): VITAMINB12, FOLATE, FERRITIN, TIBC, IRON, RETICCTPCT in the last 72 hours. Urine analysis:    Component Value Date/Time   COLORURINE YELLOW (A) 07/07/2017 2105   APPEARANCEUR HAZY (A) 07/07/2017 2105   LABSPEC 1.019 07/07/2017 2105   PHURINE 6.0 07/07/2017 2105   GLUCOSEU NEGATIVE 07/07/2017 2105   HGBUR NEGATIVE 07/07/2017 2105   BILIRUBINUR NEGATIVE 07/07/2017 2105   KETONESUR NEGATIVE 07/07/2017 2105   PROTEINUR NEGATIVE 07/07/2017 2105   NITRITE POSITIVE (A) 07/07/2017 2105   LEUKOCYTESUR MODERATE (A) 07/07/2017 2105    Sepsis Labs: @LABRCNTIP (procalcitonin:4,lacticidven:4) )No results found for this or any previous visit (from the past 240 hour(s)).   Radiological Exams on Admission: Ct Head Code Stroke Wo Contrast  Result Date: 07/07/2017 CLINICAL DATA:  Code stroke. 83 y/o F; garbled speech. Unable to move legs. EXAM: CT HEAD WITHOUT CONTRAST TECHNIQUE: Contiguous axial images were obtained from the base of the skull through the vertex without intravenous contrast. COMPARISON:  06/24/2015 CT head FINDINGS: Brain: No evidence of acute infarction, hemorrhage, hydrocephalus, extra-axial collection or mass lesion/mass effect. Stable advanced chronic microvascular ischemic changes and parenchymal volume loss of the brain. Stable chronic lacunar infarcts within left caudate head, left lentiform nucleus, and left hemi pons. Vascular: Severe calcific atherosclerosis of carotid siphons and calcified plaques of the M1 segments. 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 Skull: Normal. Negative for fracture or focal lesion.The Sinuses/Orbits: No acute finding. Other: None. ASPECTS [facility] Stroke Program Early CT Score) - Ganglionic level infarction (caudate, lentiform nuclei, internal capsule, insula, M1-M3 cortex): 7 - Supraganglionic infarction (M4-M6 cortex): 3 Total score (0-10 with 10 being normal): 10 IMPRESSION: 1. No acute intracranial abnormality identified. 2. Stable advanced chronic microvascular ischemic changes and parenchymal volume loss of the brain. 3. ASPECTS is 10 These results were called by telephone at the time of interpretation on 07/07/2017 at 8:11 pm to Dr. Roxan Hockey, who verbally acknowledged these results. Electronically Signed   By: Mitzi Hansen M.D.   On: 07/07/2017 20:12    EKG: Sinus tachycardia at 113 bpm with rightward axis and nonspecific ST-T wave changes.   Assessment/Plan  This is a 83 y.o. female with a history of CKD, dementia, diabetes, hypertension, hyperlipidemia,  hypothyroidism, obstructive sleep apnea now being admitted with:  #. AMS, likely multifactorial; rule out CVA, advancing dementia, UTI. - Admit to stroke unit, telemetry observation for neuro workup including: - Labs: CBC, BMP, Lipids, TFTs, A1C - Nursing: Neurochecks, O2, dysphagia screen, permissive hypertension.  - Consults: Neurology, PT/OT, S/S consults.  - Meds: Daily aspirin 81mg .   - Fluids: IVNS@75cc /hr.   - Routine DVT Px: with Lovenox, SCDs, early ambulation - NPO hold all meds pending S/S eval - Family would like to defer MRI/MRA, but pursue echo/carotids  #. UTI - Initiate empiric therapy with rocephin - Follow up cultures  #. Immobility s/p fall - PT eval in AM - Pain control  #. History of CK D, chronic and stable -Monitor BMP  #. History of hypertension - Hold by mouth enalapril for now  #. History of hyperlipidemia - Hold by mouth Zocor for now  #.  History of dementia/depression - Hold by mouth Paxil for now  #. History of hypothyroidism - Hold by mouth Synthroid for now  #. H/o Diabetes - Accuchecks q4h with RISS coverage - Heart healthy, carb controlled diet  Admission status: Inpatient IV Fluids: Normal saline Diet/Nutrition: Nothing by mouth Consults called: Physical therapy and neurology  DVT Px: Lovenox, SCDs and early ambulation. Code Status: DNR Disposition Plan: To home in 1-2 days  All the records are reviewed and case discussed with ED provider. Management plans discussed with the patient and/or family who express understanding and agree with plan of care.  Cutler Sunday D.O. on 07/07/2017 at 10:56 PM Between 7am to 6pm - Pager - [phone] After 6pm go to [email or web address] - [employer] [place] [facility] Office [phone] CC: Primary care physician; [employer], [employer]   07/07/2017, 10:56 PM

## 2017-07-08 ENCOUNTER — Inpatient Hospital Stay: Payer: Medicare (Managed Care)

## 2017-07-08 DIAGNOSIS — Z7189 Other specified counseling: Secondary | ICD-10-CM | POA: Diagnosis not present

## 2017-07-08 DIAGNOSIS — Z79899 Other long term (current) drug therapy: Secondary | ICD-10-CM | POA: Diagnosis not present

## 2017-07-08 DIAGNOSIS — N183 Chronic kidney disease, stage 3 (moderate): Secondary | ICD-10-CM | POA: Diagnosis present

## 2017-07-08 DIAGNOSIS — W19XXXA Unspecified fall, initial encounter: Secondary | ICD-10-CM | POA: Diagnosis present

## 2017-07-08 DIAGNOSIS — E039 Hypothyroidism, unspecified: Secondary | ICD-10-CM | POA: Diagnosis present

## 2017-07-08 DIAGNOSIS — I639 Cerebral infarction, unspecified: Principal | ICD-10-CM

## 2017-07-08 DIAGNOSIS — I129 Hypertensive chronic kidney disease with stage 1 through stage 4 chronic kidney disease, or unspecified chronic kidney disease: Secondary | ICD-10-CM | POA: Diagnosis present

## 2017-07-08 DIAGNOSIS — Z885 Allergy status to narcotic agent status: Secondary | ICD-10-CM | POA: Diagnosis not present

## 2017-07-08 DIAGNOSIS — R4182 Altered mental status, unspecified: Secondary | ICD-10-CM | POA: Diagnosis not present

## 2017-07-08 DIAGNOSIS — Z515 Encounter for palliative care: Secondary | ICD-10-CM | POA: Diagnosis not present

## 2017-07-08 DIAGNOSIS — G308 Other Alzheimer's disease: Secondary | ICD-10-CM | POA: Diagnosis not present

## 2017-07-08 DIAGNOSIS — F0280 Dementia in other diseases classified elsewhere without behavioral disturbance: Secondary | ICD-10-CM | POA: Diagnosis not present

## 2017-07-08 DIAGNOSIS — G4733 Obstructive sleep apnea (adult) (pediatric): Secondary | ICD-10-CM | POA: Diagnosis present

## 2017-07-08 DIAGNOSIS — E785 Hyperlipidemia, unspecified: Secondary | ICD-10-CM | POA: Diagnosis present

## 2017-07-08 DIAGNOSIS — G92 Toxic encephalopathy: Secondary | ICD-10-CM | POA: Diagnosis present

## 2017-07-08 DIAGNOSIS — Z66 Do not resuscitate: Secondary | ICD-10-CM | POA: Diagnosis present

## 2017-07-08 DIAGNOSIS — F329 Major depressive disorder, single episode, unspecified: Secondary | ICD-10-CM | POA: Diagnosis present

## 2017-07-08 DIAGNOSIS — E1122 Type 2 diabetes mellitus with diabetic chronic kidney disease: Secondary | ICD-10-CM | POA: Diagnosis present

## 2017-07-08 DIAGNOSIS — H3530 Unspecified macular degeneration: Secondary | ICD-10-CM | POA: Diagnosis present

## 2017-07-08 DIAGNOSIS — Z880 Allergy status to penicillin: Secondary | ICD-10-CM | POA: Diagnosis not present

## 2017-07-08 DIAGNOSIS — N3000 Acute cystitis without hematuria: Secondary | ICD-10-CM | POA: Diagnosis not present

## 2017-07-08 DIAGNOSIS — H409 Unspecified glaucoma: Secondary | ICD-10-CM | POA: Diagnosis present

## 2017-07-08 DIAGNOSIS — Z91041 Radiographic dye allergy status: Secondary | ICD-10-CM | POA: Diagnosis not present

## 2017-07-08 LAB — CBC
HCT: 35.2 % (ref 35.0–47.0)
Hemoglobin: 11.6 g/dL — ABNORMAL LOW (ref 12.0–16.0)
MCH: 28.8 pg (ref 26.0–34.0)
MCHC: 33.1 g/dL (ref 32.0–36.0)
MCV: 86.9 fL (ref 80.0–100.0)
PLATELETS: 332 10*3/uL (ref 150–440)
RBC: 4.05 MIL/uL (ref 3.80–5.20)
RDW: 12.7 % (ref 11.5–14.5)
WBC: 12.7 10*3/uL — ABNORMAL HIGH (ref 3.6–11.0)

## 2017-07-08 LAB — GLUCOSE, CAPILLARY
GLUCOSE-CAPILLARY: 131 mg/dL — AB (ref 65–99)
GLUCOSE-CAPILLARY: 213 mg/dL — AB (ref 65–99)
Glucose-Capillary: 193 mg/dL — ABNORMAL HIGH (ref 65–99)
Glucose-Capillary: 173 mg/dL — ABNORMAL HIGH (ref 65–99)
Glucose-Capillary: 108 mg/dL — ABNORMAL HIGH (ref 65–99)
Glucose-Capillary: 126 mg/dL — ABNORMAL HIGH (ref 65–99)
Glucose-Capillary: 112 mg/dL — ABNORMAL HIGH (ref 65–99)

## 2017-07-08 LAB — CREATININE, SERUM
CREATININE: 0.84 mg/dL (ref 0.44–1.00)
GFR calc Af Amer: >60 mL/min (ref >60)

## 2017-07-08 LAB — TROPONIN I: Troponin I: <0.03 ng/mL (ref <0.03)

## 2017-07-08 LAB — MRSA PCR SCREENING: MRSA BY PCR: NEGATIVE

## 2017-07-08 MED ORDER — LEVOFLOXACIN IN D5W 750 MG/150ML IV SOLN: 750.0000 mg | INTRAVENOUS | Status: DC

## 2017-07-08 MED ORDER — TROLAMINE SALICYLATE 10 % EX LOTN: 1.0000 "application " | TOPICAL_LOTION | Freq: Two times a day (BID) | CUTANEOUS | Status: DC

## 2017-07-08 MED ORDER — FLUTICASONE PROPIONATE 50 MCG/ACT NA SUSP
1.0000 | Freq: Every day | NASAL | Status: DC
  Administered 2017-07-09 – 2017-07-11 (×3): 1 via NASAL
  Filled 2017-07-08: qty 16

## 2017-07-08 MED ORDER — SENNOSIDES-DOCUSATE SODIUM 8.6-50 MG PO TABS: 1.0000 | ORAL_TABLET | Freq: Every evening | ORAL | Status: DC | PRN

## 2017-07-08 MED ORDER — BRIMONIDINE TARTRATE 0.15 % OP SOLN
1.0000 [drp] | Freq: Two times a day (BID) | OPHTHALMIC | Status: DC
  Administered 2017-07-08 – 2017-07-11 (×6): 1 [drp] via OPHTHALMIC
  Filled 2017-07-08: qty 5

## 2017-07-08 MED ORDER — ASPIRIN 300 MG RE SUPP
300.0000 mg | Freq: Every day | RECTAL | Status: DC
  Administered 2017-07-08 – 2017-07-11 (×4): 300 mg via RECTAL
  Filled 2017-07-08 (×4): qty 1

## 2017-07-08 MED ORDER — SODIUM CHLORIDE 0.9 % IV SOLN
INTRAVENOUS | Status: DC
  Administered 2017-07-08 – 2017-07-10 (×4): via INTRAVENOUS

## 2017-07-08 MED ORDER — ACETAMINOPHEN 650 MG RE SUPP
650.0000 mg | RECTAL | Status: DC | PRN
  Administered 2017-07-08: 12:00:00 650 mg via RECTAL
  Filled 2017-07-08: qty 1

## 2017-07-08 MED ORDER — LEVOFLOXACIN IN D5W 750 MG/150ML IV SOLN
750.0000 mg | Freq: Once | INTRAVENOUS | Status: AC
  Administered 2017-07-08: 750 mg via INTRAVENOUS
  Filled 2017-07-08: qty 150

## 2017-07-08 MED ORDER — SODIUM CHLORIDE 0.9 % IV SOLN
1.0000 g | Freq: Two times a day (BID) | INTRAVENOUS | Status: DC
  Administered 2017-07-08 – 2017-07-11 (×7): 1 g via INTRAVENOUS
  Filled 2017-07-08 (×8): qty 1

## 2017-07-08 MED ORDER — ONDANSETRON HCL 4 MG/2ML IJ SOLN
4.0000 mg | Freq: Four times a day (QID) | INTRAMUSCULAR | Status: DC | PRN
  Administered 2017-07-08: 4 mg via INTRAVENOUS
  Filled 2017-07-08: qty 2

## 2017-07-08 MED ORDER — STROKE: EARLY STAGES OF RECOVERY BOOK
Freq: Once | Status: AC
  Administered 2017-07-08: 06:00:00

## 2017-07-08 MED ORDER — TROLAMINE SALICYLATE 10 % EX CREA
TOPICAL_CREAM | Freq: Two times a day (BID) | CUTANEOUS | Status: DC
  Administered 2017-07-10 – 2017-07-11 (×2): via TOPICAL
  Filled 2017-07-08: qty 85

## 2017-07-08 MED ORDER — INSULIN ASPART 100 UNIT/ML ~~LOC~~ SOLN
0.0000 [IU] | SUBCUTANEOUS | Status: DC
  Administered 2017-07-08: 06:00:00 5 [IU] via SUBCUTANEOUS
  Administered 2017-07-08: 12:00:00 3 [IU] via SUBCUTANEOUS
  Administered 2017-07-09 (×3): 2 [IU] via SUBCUTANEOUS
  Filled 2017-07-08 (×5): qty 1

## 2017-07-08 MED ORDER — ACETAMINOPHEN 325 MG PO TABS: 650.0000 mg | ORAL_TABLET | ORAL | Status: DC | PRN

## 2017-07-08 MED ORDER — ENOXAPARIN SODIUM 40 MG/0.4ML ~~LOC~~ SOLN
40.0000 mg | SUBCUTANEOUS | Status: DC
  Administered 2017-07-08 – 2017-07-10 (×3): 40 mg via SUBCUTANEOUS
  Filled 2017-07-08 (×3): qty 0.4

## 2017-07-08 MED ORDER — ACETAMINOPHEN 160 MG/5ML PO SOLN: 650.0000 mg | ORAL | Status: DC | PRN

## 2017-07-08 NOTE — Progress Notes (Addendum)
Pharmacy Antibiotic Note  BONETTA STIGERS is a 83 y.o. female admitted on 07/07/2017 with UTI.  Pharmacy was originally consulted for Levaquin dosing. Pharmacy now consulted for meropenem dosing. MD reports patients outpatient UCx showed ESBL E. Coli.   Plan: Will discontinue levofloxacin 750mg   Will start Meropenem 1g IV every 12 hours based on current CrCl <17ml/min. Will monitor renal function and adjust dose as needed.   Height: 5\' 3"  (160 cm) Weight: 180 lb 8 oz (81.9 kg) IBW/kg (Calculated) : 52.4  Temp (24hrs), Avg:98.7 F (37.1 C), Min:97.5 F (36.4 C), Max:100.6 F (38.1 C)   Recent Labs Lab 07/07/17 1932 07/08/17 0312  WBC 11.1* 12.7*  CREATININE 0.77 0.84    Estimated Creatinine Clearance: 48.7 mL/min (by C-G formula based on SCr of 0.84 mg/dL).    Allergies  Allergen Reactions  . Penicillins Hives  . Telepaque [Iopanoic Acid] Other (See Comments)    Unknown reaction  . Morphine And Related Nausea And Vomiting    Antimicrobials this admission: Levaquin 8/21 >> 8/21 Meropenem 8/21 >>   Dose adjustments this admission:   Microbiology results: 8/20 UCx: pending   8/20 [id number]: many  LE(+) NO2(+) WBC TNTC  Thank you for allowing pharmacy to be a part of this patient's care.  Gardner Candle, PharmD, BCPS Clinical Pharmacist 07/08/2017 1:25 PM

## 2017-07-08 NOTE — Progress Notes (Signed)
PT Cancellation Note  Patient Details Name: Cassandra Fuentes MRN: [id number] DOB: 1929-07-13   Cancelled Treatment:    Reason Eval/Treat Not Completed: Other (comment) Consult received and chart reviewed. Pt currently NPO with pending neuro consult. Spoke with nursing, who reports pt being minimally responsive, saying pt can open her eyes, but not follow commands. Will re-attempt, when medically appropriate. Will hold therapy consult at this time.    Latanya Maudlin 07/08/2017, 10:11 AM

## 2017-07-08 NOTE — Progress Notes (Signed)
SLP Cancellation Note  Patient Details Name: Cassandra Fuentes MRN: [id number] DOB: 1929/01/28   Cancelled treatment:       Reason Eval/Treat Not Completed: Medical issues which prohibited therapy (pt poorly responsive per RN. Will retry next date.) Pt currently NPO. CT negative.   Mireyah Chervenak B. Murvin [facility], CCC-SLP Speech Pathologist [phone]  Leigh Aurora 07/08/2017, 9:08 AM

## 2017-07-08 NOTE — Progress Notes (Signed)
[employer] - [place] at [facility]                                                                                                                                                                                  Patient Demographics   Cassandra Fuentes, is a 83 y.o. female, DOB - 1929/09/09, WUJ:[id number]  Admit date - 07/07/2017   Admitting Physician Tonye Royalty, DO  Outpatient Primary MD for the patient is [facility]   LOS - 0  Subjective:  Patient admitted with altered mental status according to family and her primary care provider she has advanced dementia Patient normally able to communicate somewhat and able to eat    Review of Systems:   CONSTITUTIONAL: Confused has advanced dementia  Vitals:   Vitals:   07/08/17 0445 07/08/17 0619 07/08/17 0805 07/08/17 1325  BP: (!) 187/90 (!) 155/63 (!) 161/63   Pulse: 86 99 89   Resp: 19 17 18    Temp: 97.8 F (36.6 C) 99 F (37.2 C) (!) 100.6 F (38.1 C) 99 F (37.2 C)  TempSrc: Oral Oral Axillary Oral  SpO2: 97% 95% 94%   Weight:      Height:        Wt Readings from Last 3 Encounters:  07/08/17 180 lb 8 oz (81.9 kg)  06/24/15 175 lb (79.4 kg)     Intake/Output Summary (Last 24 hours) at 07/08/17 1327 Last data filed at 07/08/17 0654  Gross per 24 hour  Intake           293.75 ml  Output                0 ml  Net           293.75 ml    Physical Exam:   GENERAL:Elderly chronically ill appearing HEAD, EYES, EARS, NOSE AND THROAT: Atraumatic, normocephalic. Extraocular muscles are intact. Pupils equal and reactive to light. Sclerae anicteric. No conjunctival injection. No oro-pharyngeal erythema.  NECK: Supple. There is no jugular venous distention. No bruits, no lymphadenopathy, no thyromegaly.  HEART: Regular rate and rhythm,. No murmurs, no rubs, no clicks.  LUNGS: Clear to auscultation bilaterally. No rales or rhonchi. No wheezes.  ABDOMEN: Soft, flat, nontender,  nondistended. Has good bowel sounds. No hepatosplenomegaly appreciated.  EXTREMITIES: No evidence of any cyanosis, clubbing, or peripheral edema.  +2 pedal and radial pulses bilaterally.  NEUROLOGIC: Patient nonresponsive SKIN: Moist and warm with no rashes appreciated.  Psych: Not anxious, depressed LN: No inguinal LN enlargement    Antibiotics   Anti-infectives    Start     Dose/Rate Route Frequency Ordered Stop   07/10/17 1000  levofloxacin (LEVAQUIN) IVPB 750 mg  Status:  Discontinued     750 mg 100 mL/hr over 90 Minutes Intravenous Every 48 hours 07/08/17 0603 07/08/17 1323   07/09/17 1000  levofloxacin (LEVAQUIN) IVPB 750 mg  Status:  Discontinued     750 mg 100 mL/hr over 90 Minutes Intravenous Every 24 hours 07/08/17 0404 07/08/17 0603   07/08/17 1330  meropenem (MERREM) 1 g in sodium chloride 0.9 % 100 mL IVPB     1 g 200 mL/hr over 30 Minutes Intravenous Every 12 hours 07/08/17 1323     07/08/17 0415  levofloxacin (LEVAQUIN) IVPB 750 mg     750 mg 100 mL/hr over 90 Minutes Intravenous  Once 07/08/17 0404 07/08/17 0854      Medications   Scheduled Meds: . brimonidine  1 drop Both Eyes BID  . enoxaparin (LOVENOX) injection  40 mg Subcutaneous Q24H  . fluticasone  1 spray Each Nare Daily  . insulin aspart  0-15 Units Subcutaneous Q4H  . trolamine salicylate   Topical BID   Continuous Infusions: . sodium chloride 75 mL/hr at 07/08/17 0459  . meropenem (MERREM) IV    . sodium chloride     PRN Meds:.acetaminophen **OR** acetaminophen (TYLENOL) oral liquid 160 mg/5 mL **OR** acetaminophen, ondansetron (ZOFRAN) IV, senna-docusate   Data Review:   Micro Results Recent Results (from the past 240 hour(s))  MRSA PCR Screening     Status: None   Collection Time: 07/08/17  5:17 AM  Result Value Ref Range Status   MRSA by PCR NEGATIVE NEGATIVE Final    Comment:        The GeneXpert MRSA Assay (FDA approved for NASAL specimens only), is one component of  a comprehensive MRSA colonization surveillance program. It is not intended to diagnose MRSA infection nor to guide or monitor treatment for MRSA infections.     Radiology Reports Dg Abd Portable 1v  Result Date: 07/08/2017 CLINICAL DATA:  Bowel obstruction. EXAM: PORTABLE ABDOMEN - 1 VIEW COMPARISON:  CT 04/16/2006 . FINDINGS: Surgical clips right upper quadrant. No evidence of bowel distention or free air. Questionable small stone noted projected over the right kidney. Aortic atherosclerosis. Degenerative changes thoracolumbar spine. Lower pelvis not imaged. IMPRESSION: 1.  No evidence of bowel distention or free air. 2.  Questionable small stone noted projected over the right kidney . Electronically Signed   By: Maisie Fus  Register   On: 07/08/2017 06:01   Ct Head Code Stroke Wo Contrast  Result Date: 07/07/2017 CLINICAL DATA:  Code stroke. 83 y/o F; garbled speech. Unable to move legs. EXAM: CT HEAD WITHOUT CONTRAST TECHNIQUE: Contiguous axial images were obtained from the base of the skull through the vertex without intravenous contrast. COMPARISON:  06/24/2015 CT head FINDINGS: Brain: No evidence of acute infarction, hemorrhage, hydrocephalus, extra-axial collection or mass lesion/mass effect. Stable advanced chronic microvascular ischemic changes and parenchymal volume loss of the brain. Stable chronic lacunar infarcts within left caudate head, left lentiform nucleus, and left hemi pons. Vascular: Severe calcific atherosclerosis of carotid siphons and calcified plaques of the M1 segments. 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 Skull: Normal. Negative for fracture or focal lesion.The Sinuses/Orbits: No acute finding. Other: None. ASPECTS [facility] Stroke Program Early CT Score) - Ganglionic level infarction (caudate, lentiform nuclei, internal capsule, insula, M1-M3 cortex): 7 - Supraganglionic infarction (M4-M6 cortex): 3 Total score (0-10 with 10 being normal): 10 IMPRESSION: 1. No acute  intracranial abnormality identified. 2. Stable advanced chronic microvascular ischemic changes and parenchymal volume loss  of the brain. 3. ASPECTS is 10 These results were called by telephone at the time of interpretation on 07/07/2017 at 8:11 pm to Dr. Roxan Hockey, who verbally acknowledged these results. Electronically Signed   By: Mitzi Hansen M.D.   On: 07/07/2017 20:12     CBC  Recent Labs Lab 07/07/17 1932 07/08/17 0312  WBC 11.1* 12.7*  HGB 12.4 11.6*  HCT 37.5 35.2  PLT 355 332  MCV 89.4 86.9  MCH 29.5 28.8  MCHC 33.0 33.1  RDW 12.9 12.7  LYMPHSABS 2.5  --   MONOABS 1.5*  --   EOSABS 0.3  --   BASOSABS 0.1  --     Chemistries   Recent Labs Lab 07/07/17 1932 07/08/17 0312  NA 141  --   K 3.6  --   CL 100*  --   CO2 31  --   GLUCOSE 179*  --   BUN 18  --   CREATININE 0.77 0.84  CALCIUM 9.2  --   AST 20  --   ALT 13*  --   ALKPHOS 78  --   BILITOT 0.5  --    ------------------------------------------------------------------------------------------------------------------ estimated creatinine clearance is 48.7 mL/min (by C-G formula based on SCr of 0.84 mg/dL). ------------------------------------------------------------------------------------------------------------------ No results for input(s): HGBA1C in the last 72 hours. ------------------------------------------------------------------------------------------------------------------ No results for input(s): CHOL, HDL, LDLCALC, TRIG, CHOLHDL, LDLDIRECT in the last 72 hours. ------------------------------------------------------------------------------------------------------------------ No results for input(s): TSH, T4TOTAL, T3FREE, THYROIDAB in the last 72 hours.  Invalid input(s): FREET3 ------------------------------------------------------------------------------------------------------------------ No results for input(s): VITAMINB12, FOLATE, FERRITIN, TIBC, IRON, RETICCTPCT in the last 72  hours.  Coagulation profile No results for input(s): INR, PROTIME in the last 168 hours.  No results for input(s): DDIMER in the last 72 hours.  Cardiac Enzymes  Recent Labs Lab 07/07/17 1932 07/08/17 0312  TROPONINI <0.03 <0.03   ------------------------------------------------------------------------------------------------------------------ Invalid input(s): POCBNP    Assessment & Plan   This is a 83 y.o. female with a history of CKD, dementia, diabetes, hypertension, hyperlipidemia, hypothyroidism, obstructive sleep apnea now being admitted with:  #. AMS, likely multifactorial;Including due to UTI as well as possible CVA I have discussed with patient's daughter and they would not like an echo carotid Dopplers MRI etc. We will cancel stroke workup We have asked decided to give patient IV antibiotics to see if she improves if no improvement then we'll make patient comfort care  #. UTI I received a call from the patient's program nurse practitioner who follows the patient states that patient's urine culture was positive for Escherichia coli resistant to Levaquin and sensitive to penicillins, also resistant to Bactrim sensitive to doxycycline I will treat with meropenem due to severity of her symptoms    #. Immobility s/p fall - PT eval once awake  #. History of CK D, chronic and stable -Monitor BMP  #. History of hypertension - Blood pressure medications on hold  #. History of hyperlipidemia - Hold by mouth Zocor for now  #. History of dementia/depression - Hold by mouth Paxil for now  #. History of hypothyroidism - Hold by mouth Synthroid for now  #. H/o Diabetes - Accuchecks q4h with RISS coverage - Heart healthy, carb controlled diet     Code Status Orders        Start     Ordered   07/08/17 0323  Do not attempt resuscitation (DNR)  Continuous    Question Answer Comment  In the event of cardiac or respiratory ARREST Do not call  a "code blue"    In the event of cardiac or respiratory ARREST Do not perform Intubation, CPR, defibrillation or ACLS   In the event of cardiac or respiratory ARREST Use medication by any route, position, wound care, and other measures to relive pain and suffering. May use oxygen, suction and manual treatment of airway obstruction as needed for comfort.      07/08/17 0323    Code Status History    Date Active Date Inactive Code Status Order ID Comments User Context   07/08/2017  2:34 AM 07/08/2017  3:23 AM Full Code [id number]  Hugelmeyer, Jon Gills, DO Inpatient    Advance Directive Documentation     Most Recent Value  Type of Advance Directive  Out of facility DNR (pink MOST or yellow form)  Pre-existing out of facility DNR order (yellow form or pink MOST form)  -  "MOST" Form in Place?  -           Consults none  DVT Prophylaxis  Lovenox  Lab Results  Component Value Date   PLT 332 07/08/2017     Time Spent in minutes   Greater than 50% of time spent in care coordination and counseling patient regarding the condition and plan of care.   Auburn Bilberry M.D on 07/08/2017 at 1:27 PM  Between 7am to 6pm - Pager - [phone]  After 6pm go to [email or web address] - password [email or web address] [facility]  [facility]   Office  [phone]

## 2017-07-08 NOTE — Care Management Note (Signed)
Case Management Note  Patient Details  Name: Cassandra Fuentes MRN: [id number] Date of Birth: [birth year]  Subjective/Objective:   Admitted to [facility] with the diagnosis of altered mental status. Lives at [facility] x 3 years. Daughter is Lelon Perla  [phone]). A client of [facility] x 7 years. Home Health per [facility] in the past. No skilled facility. No home oxygen. 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 Self feed, needs help with baths and dressing. Last fall was 2 weeks ago. Good appetite until yesterday, can't swallow now.                 Action/Plan: Will continue to follow for discharge needs.   Expected Discharge Date:  07/09/17               Expected Discharge Plan:     In-House Referral:     Discharge planning Services     Post Acute Care Choice:    Choice offered to:     DME Arranged:    DME Agency:     HH Arranged:    HH Agency:     Status of Service:     If discussed at [employer], dates discussed:    Additional Comments:  Gwenette Greet, RNMSN CCM Care Management [phone] 07/08/2017, 10:31 AM

## 2017-07-08 NOTE — Clinical Social Work Note (Signed)
Clinical Social Work Assessment  Patient Details  Name: Cassandra Fuentes MRN: [id number] Date of Birth: 07/13/1929  Date of referral:  07/08/17               Reason for consult:  Facility Placement                Permission sought to share information with:  Facility Medical sales representative, Family Supports Permission granted to share information::  Yes, Verbal Permission Granted  Name::     Cassandra Fuentes Daughter [phone] and [facility]  Agency::  SNF admissions and  [facility].  Relationship::     Contact Information:     Housing/Transportation Living arrangements for the past 2 months:  Assisted Living Facility Source of Information:  Adult Children Patient Interpreter Needed:  None Criminal Activity/Legal Involvement Pertinent to Current Situation/Hospitalization:  No - Comment as needed Significant Relationships:  Adult Children Lives with:  Facility Resident Do you feel safe going back to the place where you live?  No Need for family participation in patient care:  Yes (Comment)  Care giving concerns:  Patient's family is not sure if she is going to return to [facility]F or if she will need SNF.   Social Worker assessment / plan:  Patient is an 83 year old female who lives at [facility].  Patient was confused assessment completed by speaking with patient's daughter.  Patient has been living at [facility] for about 2.5 years, patient's daughter did not express any concerns about her returning back to ALF.  Patient's daughter stated that patient has been to SNF before, and patient is also a participant in the [facility].  Patient's daughter expressed she is currently not sure if patient will be able to return back to ALF, she may have to transition to a SNF as a long term care resident.  Patient's daughter expressed that Arita Miss is involved and very help with patient.  CSW explained role of social worker and what the process is for  determining what the discharge plan will be.  Patient is waiting to be evaluated by PT and OT to give recommendations.  Patient's daughter is open to having patient go to SNF or if she is able to return to [facility].  Patient's daughter did not express any other questions or concerns.  Employment status:  Retired Database administrator PT Recommendations:  Not assessed at this time Information / Referral to community resources:  Skilled Nursing Facility  Patient/Family's Response to care:  Patient's family is waiting to see how patient does, she will either return back to ALF or have to go to higher level of care at [facility].  Patient/Family's Understanding of and Emotional Response to Diagnosis, Current Treatment, and Prognosis:  Patient's daughter expressed understanding of patient's current treatment plan and prognosis.  Palliative is involved with patient's family to help with goals of care decision making.  Emotional Assessment Appearance:  Appears stated age Attitude/Demeanor/Rapport:    Affect (typically observed):  Appropriate, Calm Orientation:    Alcohol / Substance use:  Not Applicable Psych involvement (Current and /or in the community):  No (Comment)  Discharge Needs  Concerns to be addressed:  Care Coordination Readmission within the last 30 days:  No Current discharge risk:  Cognitively Impaired Barriers to Discharge:  Continued Medical Work up   [place] Constable 07/08/2017, 6:03 PM

## 2017-07-08 NOTE — Consult Note (Signed)
Referring Physician: Allena Katz    Chief Complaint: Garbled speech, difficulty swallowing  HPI: Cassandra Fuentes is an 83 y.o. female with a history of dementia who by report of daughter was noted after lunch by staff at facility to be drooling more and have difficulty with speech.  Patient was transported for evaluation at that time.  Initial NIHSS of 25.   Patient at baseline was ambulatory with a walker and had spontaneous speech although not always related to what was being discussed.  Incontinent.  Had a fall about 10 days ago and since that time has not been ambulatory.    Date last known well: Date: 07/07/2017 Time last known well: Time: 12:00 tPA Given: No: Family does not want aggressive intervention  Past Medical History:  Diagnosis Date  . Chronic kidney disease   . Dementia   . Diabetes mellitus without complication (HCC)   . Glaucoma   . Hyperlipidemia   . Hypertension   . Hypothyroidism   . Hypothyroidism   . Macular degeneration   . Obstructive sleep apnea   . Urinary incontinence     History reviewed. No pertinent surgical history.  Family history: Daughter alive and well. Social History:  reports that she has never smoked. She does not have any smokeless tobacco history on file. She reports that she does not drink alcohol or use drugs.  Allergies:  Allergies  Allergen Reactions  . Penicillins Hives  . Telepaque [Iopanoic Acid] Other (See Comments)    Unknown reaction  . Morphine And Related Nausea And Vomiting    Medications:  I have reviewed the patient's current medications. Prior to Admission:  Prescriptions Prior to Admission  Medication Sig Dispense Refill Last Dose  . acetaminophen (TYLENOL) 325 MG tablet Take 325 mg by mouth every Monday, Wednesday, and Friday. Give at [facility] on center days at 12p.   06/23/2015 at Unknown time  . acetaminophen (TYLENOL) 500 MG tablet Take 500 mg by mouth every 4 (four) hours as needed for mild pain, fever or headache.    unknown  . acetaminophen (TYLENOL) 650 MG CR tablet Take 1,300 mg by mouth 2 (two) times daily.   06/24/2015 at Unknown time  . Alum & Mag Hydroxide-Simeth (GERI-LANTA PO) Take 30 mLs by mouth every 6 (six) hours as needed (for heartburn/indigestion). Not to exceed 4 doses in 24 Hours.   unknown  . brimonidine (ALPHAGAN) 0.15 % ophthalmic solution Place 1 drop into both eyes 2 (two) times daily.   06/24/2015 at Unknown time  . Calcium Carbonate-Vitamin D (CALCIUM 600+D) 600-400 MG-UNIT per tablet Take 1 tablet by mouth every morning.   06/24/2015 at Unknown time  . clopidogrel (PLAVIX) 75 MG tablet Take 75 mg by mouth daily.   06/24/2015 at Unknown time  . enalapril (VASOTEC) 20 MG tablet Take 20 mg by mouth 2 (two) times daily.   06/24/2015 at Unknown time  . guaiFENesin (Q-TUSSIN) 100 MG/5ML liquid Take 200 mg by mouth every 6 (six) hours as needed for cough. Not to exceed 4 doses in 24 hours.   unknnown  . levothyroxine (SYNTHROID, LEVOTHROID) 150 MCG tablet Take 150 mcg by mouth daily before breakfast.   06/24/2015 at Unknown time  . loperamide (IMODIUM) 2 MG capsule Take 2 mg by mouth every 3 (three) hours as needed for diarrhea or loose stools. Not to exceed 8 doses in 24 hours.   unknown  . magnesium hydroxide (MILK OF MAGNESIA) 400 MG/5ML suspension Take 30 mLs by mouth at  bedtime as needed for mild constipation or moderate constipation.   unknown  . Melatonin 5 MG TABS Take 5 mg by mouth at bedtime.   06/23/2015 at Unknown time  . mometasone (NASONEX) 50 MCG/ACT nasal spray Place 1 spray into the nose daily.   06/24/2015 at Unknown time  . multivitamin-lutein (OCUVITE-LUTEIN) CAPS capsule Take 1 capsule by mouth daily.   06/24/2015 at Unknown time  . PARoxetine (PAXIL) 10 MG tablet Take 10 mg by mouth daily.   06/24/2015 at Unknown time  . simvastatin (ZOCOR) 40 MG tablet Take 40 mg by mouth every evening.   06/23/2015 at Unknown time  . Trolamine Salicylate (ASPERCREME) 10 % LOTN Apply 1 application topically 2  (two) times daily. Apply to right shoulder and right knee.   06/24/2015 at Unknown time   Scheduled: . brimonidine  1 drop Both Eyes BID  . enoxaparin (LOVENOX) injection  40 mg Subcutaneous Q24H  . fluticasone  1 spray Each Nare Daily  . insulin aspart  0-15 Units Subcutaneous Q4H  . trolamine salicylate   Topical BID    ROS: Patient unable to provide  Physical Examination: Blood pressure (!) 161/63, pulse 89, temperature 99 F (37.2 C), temperature source Oral, resp. rate 18, height 5\' 3"  (1.6 m), weight 81.9 kg (180 lb 8 oz), SpO2 94 %.  HEENT-  Normocephalic, no lesions, without obvious abnormality.  Normal external eye and conjunctiva.  Normal TM's bilaterally.  Normal auditory canals and external ears. Normal external nose, mucus membranes and septum.  Normal pharynx. Cardiovascular- S1, S2 normal, pulses palpable throughout   Lungs- chest clear, no wheezing, rales, normal symmetric air entry Abdomen- soft, non-tender; bowel sounds normal; no masses,  no organomegaly Extremities- no edema Lymph-no adenopathy palpable Musculoskeletal-no joint tenderness, deformity or swelling Skin-pressure injury  Neurological Examination   Mental Status: Alert.  Speech only consists of grunts and groans.  Does not follow commands.. Cranial Nerves: II: Discs flat bilaterally; Blinks to bilateral confrontation. Pupils equal, round, reactive to light and accommodation III,IV, VI: ptosis not present, tracks family members around the room V,VII: grimace symmetric, facial light touch sensation normal bilaterally VIII: unable to test IX,X: gag reflex reduced XI: bilateral shoulder shrug XII: unable to test Motor: Moves upper extremities against gravity spontaneously.  Minimal movement noted of the lower extremities.   Sensory: Responds to light noxious stimuli throughout Deep Tendon Reflexes: 2+ and symmetric throughout Plantars: Right: upgoing   Left: upgoing Cerebellar: Unable to  perform Gait: unable to perform    Laboratory Studies:  Basic Metabolic Panel:  Recent Labs Lab 07/07/17 1932 07/08/17 0312  NA 141  --   K 3.6  --   CL 100*  --   CO2 31  --   GLUCOSE 179*  --   BUN 18  --   CREATININE 0.77 0.84  CALCIUM 9.2  --     Liver Function Tests:  Recent Labs Lab 07/07/17 1932  AST 20  ALT 13*  ALKPHOS 78  BILITOT 0.5  PROT 7.2  ALBUMIN 3.6   No results for input(s): LIPASE, AMYLASE in the last 168 hours. No results for input(s): AMMONIA in the last 168 hours.  CBC:  Recent Labs Lab 07/07/17 1932 07/08/17 0312  WBC 11.1* 12.7*  NEUTROABS 6.7*  --   HGB 12.4 11.6*  HCT 37.5 35.2  MCV 89.4 86.9  PLT 355 332    Cardiac Enzymes:  Recent Labs Lab 07/07/17 1932 07/08/17 0312  TROPONINI <0.03 <0.03  BNP: Invalid input(s): POCBNP  CBG:  Recent Labs Lab 07/08/17 0512 07/08/17 0807 07/08/17 1137  GLUCAP 213* 193* 173*    Microbiology: Results for orders placed or performed during the hospital encounter of 07/07/17  MRSA PCR Screening     Status: None   Collection Time: 07/08/17  5:17 AM  Result Value Ref Range Status   MRSA by PCR NEGATIVE NEGATIVE Final    Comment:        The GeneXpert MRSA Assay (FDA approved for NASAL specimens only), is one component of a comprehensive MRSA colonization surveillance program. It is not intended to diagnose MRSA infection nor to guide or monitor treatment for MRSA infections.     Coagulation Studies: No results for input(s): LABPROT, INR in the last 72 hours.  Urinalysis:  Recent Labs Lab 07/07/17 2105  COLORURINE YELLOW*  LABSPEC 1.019  PHURINE 6.0  GLUCOSEU NEGATIVE  HGBUR NEGATIVE  BILIRUBINUR NEGATIVE  KETONESUR NEGATIVE  PROTEINUR NEGATIVE  NITRITE POSITIVE*  LEUKOCYTESUR MODERATE*    Lipid Panel: No results found for: CHOL, TRIG, HDL, CHOLHDL, VLDL, LDLCALC  HgbA1C: No results found for: HGBA1C  Urine Drug Screen:  No results found for:  LABOPIA, COCAINSCRNUR, LABBENZ, AMPHETMU, THCU, LABBARB  Alcohol Level: No results for input(s): ETH in the last 168 hours.  Other results: EKG: sinus tachycardia at 113 bpm.  Imaging: Dg Abd Portable 1v  Result Date: 07/08/2017 CLINICAL DATA:  Bowel obstruction. EXAM: PORTABLE ABDOMEN - 1 VIEW COMPARISON:  CT 04/16/2006 . FINDINGS: Surgical clips right upper quadrant. No evidence of bowel distention or free air. Questionable small stone noted projected over the right kidney. Aortic atherosclerosis. Degenerative changes thoracolumbar spine. Lower pelvis not imaged. IMPRESSION: 1.  No evidence of bowel distention or free air. 2.  Questionable small stone noted projected over the right kidney . Electronically Signed   By: Maisie Fus  Register   On: 07/08/2017 06:01   Ct Head Code Stroke Wo Contrast  Result Date: 07/07/2017 CLINICAL DATA:  Code stroke. 83 y/o F; garbled speech. Unable to move legs. EXAM: CT HEAD WITHOUT CONTRAST TECHNIQUE: Contiguous axial images were obtained from the base of the skull through the vertex without intravenous contrast. COMPARISON:  06/24/2015 CT head FINDINGS: Brain: No evidence of acute infarction, hemorrhage, hydrocephalus, extra-axial collection or mass lesion/mass effect. Stable advanced chronic microvascular ischemic changes and parenchymal volume loss of the brain. Stable chronic lacunar infarcts within left caudate head, left lentiform nucleus, and left hemi pons. Vascular: Severe calcific atherosclerosis of carotid siphons and calcified plaques of the M1 segments. 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 Skull: Normal. Negative for fracture or focal lesion.The Sinuses/Orbits: No acute finding. Other: None. ASPECTS [facility] Stroke Program Early CT Score) - Ganglionic level infarction (caudate, lentiform nuclei, internal capsule, insula, M1-M3 cortex): 7 - Supraganglionic infarction (M4-M6 cortex): 3 Total score (0-10 with 10 being normal): 10 IMPRESSION: 1. No acute  intracranial abnormality identified. 2. Stable advanced chronic microvascular ischemic changes and parenchymal volume loss of the brain. 3. ASPECTS is 10 These results were called by telephone at the time of interpretation on 07/07/2017 at 8:11 pm to Dr. Roxan Hockey, who verbally acknowledged these results. Electronically Signed   By: Mitzi Hansen M.D.   On: 07/07/2017 20:12    Assessment: 83 y.o. female presenting with difficulty with speech and swallowing.  Suspect an acute infarct.  Head CT reviewed and shows no acute changes.  Unable to perform MRI because patient unable to lie flat.  On Plavix prior to admission.  Carotid dopplers show no evidence of hemodynamically significant stenosis on the right, LICA at 50-75% stenosis.  Family has elected to not be aggressive with management.  Patient currently NPO.    Stroke Risk Factors - diabetes mellitus, hyperlipidemia and hypertension  Plan: 1. HgbA1c, fasting lipid panel 2. PT consult, OT consult, Speech consult 3. Prophylactic therapy-Due to patient being NPO would start ASA 300mg  rectally, daily.   4. Telemetry monitoring 5. Frequent neuro checks 6. If patient not going to have a PEG and has no improvement with treatment of UTI would require a palliative consult.  If PEG to be placed or swallowing improves would consider an echocardiogram in work up as well.     Thana Farr, MD Neurology [phone] 07/08/2017, 1:25 PM

## 2017-07-08 NOTE — Progress Notes (Signed)
Pharmacy Antibiotic Note  Cassandra Fuentes is a 83 y.o. female admitted on 07/07/2017 with UTI.  Pharmacy has been consulted for Levaquin dosing.  Plan: Levaquin 750 mg q 24 hours ordered.  Height: 5\' 3"  (160 cm) Weight: 180 lb 8 oz (81.9 kg) IBW/kg (Calculated) : 52.4  Temp (24hrs), Avg:98 F (36.7 C), Min:97.5 F (36.4 C), Max:98.5 F (36.9 C)   Recent Labs Lab 07/07/17 1932 07/08/17 0312  WBC 11.1* 12.7*  CREATININE 0.77  --     Estimated Creatinine Clearance: 51.2 mL/min (by C-G formula based on SCr of 0.77 mg/dL).    Allergies  Allergen Reactions  . Penicillins Hives  . Telepaque [Iopanoic Acid] Other (See Comments)    Unknown reaction  . Morphine And Related Nausea And Vomiting    Antimicrobials this admission: Levaquin 8/21 >>    >>   Dose adjustments this admission:   Microbiology results: 8/20 UCx: pending       8/20 UA: LE(+) NO2(+) WBC TNTC  Thank you for allowing pharmacy to be a part of this patient's care.  Lener Ventresca S 07/08/2017 4:04 AM

## 2017-07-08 NOTE — Progress Notes (Signed)
OT Cancellation Note  Patient Details Name: Cassandra Fuentes MRN: [id number] DOB: 03-Aug-1929   Cancelled Treatment:    Reason Eval/Treat Not Completed: Fatigue/lethargy limiting ability to participate  Pt currently NPO with pending neuro consult. Pt is minimally responsive and unable to follow commands. Will re-attempt, when medically appropriate. Will hold therapy consult at this time.  Thank you for the referral.  Susanne Borders, OTR/L ascom [phone] 07/08/17, 10:31 AM

## 2017-07-08 NOTE — Progress Notes (Signed)
Discontinue stroke protocol per Dr Allena Katz.

## 2017-07-08 NOTE — Consult Note (Signed)
WOC Nurse wound consult note Reason for Consult:Stage 2 pressure injury to left ischium.  Present on admission.  Discussed with family at bedside.  Since recent fall, patient has been confined to wheelchair more.  Advised to have therapist at SNF eval seating surface in wheelchair and ensure it is a pressure redistribution surface.  Discussed frequent, small position changes while up in bed as well.  Wound type:pressure injury Pressure Injury POA: Yes Measurement: 2 cm x 2 cm x0.1 cm (nonintact only in center) Wound ENM:MHWK and moist Drainage (amount, consistency, odor) scant serous weeping Periwound:Blanchable erythema Dressing procedure/placement/frequency:Cleanse left ischial wound with NS and pat dry.  Silicone border foam.  No disposable briefs or underpads while in bed.  Will order mattress with low air loss feature to offload pressure and improve skin microclimate. Will not follow at this time.  Please re-consult if needed.  Maple Hudson RN BSN CWON Pager [phone]

## 2017-07-09 DIAGNOSIS — Z515 Encounter for palliative care: Secondary | ICD-10-CM

## 2017-07-09 DIAGNOSIS — Z7189 Other specified counseling: Secondary | ICD-10-CM

## 2017-07-09 DIAGNOSIS — R4182 Altered mental status, unspecified: Secondary | ICD-10-CM

## 2017-07-09 DIAGNOSIS — N3000 Acute cystitis without hematuria: Secondary | ICD-10-CM

## 2017-07-09 LAB — GLUCOSE, CAPILLARY
GLUCOSE-CAPILLARY: 105 mg/dL — AB (ref 65–99)
GLUCOSE-CAPILLARY: 82 mg/dL (ref 65–99)
GLUCOSE-CAPILLARY: 105 mg/dL — AB (ref 65–99)
Glucose-Capillary: 115 mg/dL — ABNORMAL HIGH (ref 65–99)
Glucose-Capillary: 130 mg/dL — ABNORMAL HIGH (ref 65–99)
Glucose-Capillary: 135 mg/dL — ABNORMAL HIGH (ref 65–99)

## 2017-07-09 NOTE — Evaluation (Signed)
Physical Therapy Evaluation Patient Details Name: MERELIN HUMAN MRN: [id number] DOB: Aug 24, 1929 Today's Date: 07/09/2017   History of Present Illness  Pt is a 83 yo F, admitted to acute care with alterned mental status on 08/20. Prior to admission, pt required assistance for all ADL's, and amb with rollator. PMH: dementia, CKD, diabetes mellitus, glaucoma, HLD, HTN, hypothyroidism, macular degineration, obstructive sleep apnea, and urinary incontinence.   Clinical Impression  Pt pleasant, but unable to verbalize concerns due to severe dementia. Pt lethargic upon arrival, but aroused with verbal cues. Pt family present during eval, and very helpful and supportive of the pt; reported pt attempting to get out of bed this AM. Pt performs bed mobility rolling with ModA +1 and supine to sit with MaxA +2. Pt was able to follow commands minimally, and able to assist with rolling. Limitations of today's session were primarily limited by lethargy. Tranfers and ambulation deferred due to safety concerns. Pt presents with the following deficits: strength, endurance, balance, safety awareness, and limited ability to follow commands. Overall, pt responded well to today's treatment with no adverse affects. Pt would benefit from skilled PT to address the previously mentioned impairments and promote return to PLOF. Pt was able to participate  During therapy, and amb with ModI using RW, prior to admission; currently, recommending SNF, pending d/c.      Follow Up Recommendations SNF    Equipment Recommendations  None recommended by PT    Recommendations for Other Services       Precautions / Restrictions Precautions Precautions: Fall Restrictions Weight Bearing Restrictions: No      Mobility  Bed Mobility Overal bed mobility: Needs Assistance Bed Mobility: Rolling;Supine to Sit Rolling: Mod assist   Supine to sit: Max assist;+2 for physical assistance     General bed mobility comments: Pt  requires max cues and modA with rolling L<>R. verbal tactlie and visual cue to reach hand over hand. But able to hold onto rails and assist with rolling shoulders and hips. ModA to maintain position. maxA +2 for supine to sit, requiring max verbal and tactile cues. Pt given assist with handover hand to reach for bedrail. Pt distractible an dprovided hand held assist, rather than use of bed rail for coming to sit.   Transfers                 General transfer comment: Deferred due to safety concerns  Ambulation/Gait             General Gait Details: Deferred due to safety concerns  Stairs            Wheelchair Mobility    Modified Rankin (Stroke Patients Only)       Balance Overall balance assessment: Needs assistance Sitting-balance support: Bilateral upper extremity supported;Feet supported Sitting balance-Leahy Scale: Poor Sitting balance - Comments: Pt with poor sitting balance, with tendency to lose balance posteriorly. MaxA to maintain sitting balance. Pt was able to maintain sitting balance with CGA for ~5 sec, but was leaning posteriorly throughout.                                      Pertinent Vitals/Pain Pain Assessment: No/denies pain    Home Living Family/patient expects to be discharged to:: Assisted living               Home Equipment: Dan Humphreys - 4 wheels Additional Comments: [employer]  ALF: memory care unit. Amb with rollator at baseline. Per pt daughters report, pt had a fall 2 weeks ago, resulting in cut to eye, and has not been amb since.     Prior Function Level of Independence: Needs assistance         Comments: Pt poor histoiad due to advanced dementia. Daughter present to provide pt history. Pt required assist with all ADL's/IADL's, and enjoyed walkign prior to admission     Hand Dominance        Extremity/Trunk Assessment   Upper Extremity Assessment Upper Extremity Assessment: Difficult to assess due to  impaired cognition    Lower Extremity Assessment Lower Extremity Assessment: Difficult to assess due to impaired cognition (Pt unable to follow commands to lift LE's against gravity )       Communication   Communication: Other (comment) (non-verbal. Intermittent talking at baseline)  Cognition Arousal/Alertness: Lethargic;Suspect due to medications Behavior During Therapy: Flat affect Overall Cognitive Status: Impaired/Different from baseline                                 General Comments: Pt lethargic, intermittently falling asleep during session. Able to open eyes and look at therapist with verbal stimulation. Follows 1-step commands occassionally.       General Comments      Exercises Other Exercises Other Exercises: Diaper change, rolling L<>R. See bed mobility for further details.    Assessment/Plan    PT Assessment Patient needs continued PT services  PT Problem List Decreased strength;Decreased activity tolerance;Decreased balance;Decreased mobility;Decreased coordination;Decreased cognition;Decreased safety awareness       PT Treatment Interventions DME instruction;Gait training;Functional mobility training;Therapeutic activities;Therapeutic exercise;Balance training;Neuromuscular re-education;Patient/family education;Cognitive remediation;Manual techniques    PT Goals (Current goals can be found in the Care Plan section)  Acute Rehab PT Goals Patient Stated Goal: unable to state PT Goal Formulation: With patient/family Time For Goal Achievement: 07/23/17 Potential to Achieve Goals: Good    Frequency Min 2X/week   Barriers to discharge        Co-evaluation               AM-PAC PT "6 Clicks" Daily Activity  Outcome Measure Difficulty turning over in bed (including adjusting bedclothes, sheets and blankets)?: Unable Difficulty moving from lying on back to sitting on the side of the bed? : Unable Difficulty sitting down on and standing  up from a chair with arms (e.g., wheelchair, bedside commode, etc,.)?: Unable Help needed moving to and from a bed to chair (including a wheelchair)?: Total Help needed walking in hospital room?: Total Help needed climbing 3-5 steps with a railing? : Total 6 Click Score: 6    End of Session   Activity Tolerance: Patient limited by lethargy Patient left: in bed;with call bell/phone within reach;with bed alarm set;with nursing/sitter in room;with family/visitor present Nurse Communication: Mobility status PT Visit Diagnosis: Unsteadiness on feet (R26.81);Other abnormalities of gait and mobility (R26.89);Muscle weakness (generalized) (M62.81);History of falling (Z91.81)    Time: 5945-8592 PT Time Calculation (min) (ACUTE ONLY): 22 min   Charges:         PT G Codes:        Sharman Cheek PT, SPT  Latanya Maudlin 07/09/2017, 12:22 PM

## 2017-07-09 NOTE — Consult Note (Signed)
Consultation Note Date: 07/09/2017   Patient Name: Cassandra Fuentes  DOB: Mar 04, 1929  MRN: [id number]  Age / Sex: 83 y.o., female  PCP: [facility] [place] Referring Physician: Hillary Bow, MD  Reason for Consultation: Establishing goals of care and Psychosocial/spiritual support  HPI/Patient Profile: 83 y.o. female  with past medical history of dementia, DM, OSA and chronic kidney disease who was admitted on 07/07/2017 with altered mental status, drooling and garbled speech.  She was felt to have had a stroke.  Urinalysis showed ecoli UTI.  Family prefers not to go thru aggressive stroke work up as they feel it will not change the end results.  During her speech evaluation patient was unable to take POs .   Clinical Assessment and Goals of Care:  I have reviewed medical records including EPIC notes, labs and imaging, received report from Dr. Darvin Neighbours assessed the patient and then met at the bedside along with her dtr Ivin Booty (primary care taker) and son Iona Beard to discuss diagnosis prognosis, [place], EOL wishes, disposition and options.  I introduced Palliative Medicine as specialized medical care for people living with serious illness. It focuses on providing relief from the symptoms and stress of a serious illness. The goal is to improve quality of life for both the patient and the family.  We discussed a brief life review of the patient. 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  She was diagnosed with dementia 7 years ago and Ivin Booty has been taking care of her since.  She has lived at [facility] for the past 2.5 years.  She was walking until 10 days ago when she fell and has not walked since.  We discussed their current illness and what it means in the larger context of their on-going co-morbidities.  Natural disease trajectory and expectations at  EOL were discussed.  I explained that if her symptoms were caused by the UTI, 3 days worth of IV antibiotics should improve the symptoms.  Ivin Booty and Iona Beard understand clearly that if she is unable to eat she will not survive.  They are not interested in placing a tube for artificial feeding.  Hospice and Palliative Care services outpatient were explained and offered.  We discussed Hospice House if the patient is unable to eat or drink.  The family will consider it based on how she does over the next day or so.  Questions and concerns were addressed.  The family was encouraged to call with questions or concerns.        Primary Decision Maker:  NEXT OF KIN dtr Ivin Booty (primary), son Iona Beard    SUMMARY OF RECOMMENDATIONS    Code Status/Advance Care Planning:  DNR   Symptom Management:   Comfortable at this point.     Psycho-social/Spiritual:   Desire for further Chaplaincy support: yes  Prognosis:   To be determined - based on whether or not she is able to eat safely    Discharge Planning: To  Be Determined      Primary Diagnoses: Present on Admission: . Altered mental status   I have reviewed the medical record, interviewed the patient and family, and examined the patient. The following aspects are pertinent.  Past Medical History:  Diagnosis Date  . Chronic kidney disease   . Dementia   . Diabetes mellitus without complication ([place])   . Glaucoma   . Hyperlipidemia   . Hypertension   . Hypothyroidism   . Hypothyroidism   . Macular degeneration   . Obstructive sleep apnea   . Urinary incontinence    Social History   Social History  . Marital status: Divorced    Spouse name: N/A  . Number of children: N/A  . Years of education: N/A   Social History Main Topics  . Smoking status: Never Smoker  . Smokeless tobacco: None  . Alcohol use No  . Drug use: No  . Sexual activity: Not Asked   Other Topics Concern  . None   Social History Narrative  .  None   History reviewed. No pertinent family history. Scheduled Meds: . aspirin  300 mg Rectal Daily  . brimonidine  1 drop Both Eyes BID  . enoxaparin (LOVENOX) injection  40 mg Subcutaneous Q24H  . fluticasone  1 spray Each Nare Daily  . insulin aspart  0-15 Units Subcutaneous V0J  . trolamine salicylate   Topical BID   Continuous Infusions: . sodium chloride 75 mL/hr at 07/09/17 1024  . meropenem (MERREM) IV Stopped (07/09/17 1133)  . sodium chloride     PRN Meds:.acetaminophen **OR** acetaminophen (TYLENOL) oral liquid 160 mg/5 mL **OR** acetaminophen, ondansetron (ZOFRAN) IV, senna-docusate Allergies  Allergen Reactions  . Penicillins Hives  . Telepaque [Iopanoic Acid] Other (See Comments)    Unknown reaction  . Morphine And Related Nausea And Vomiting   Review of Systems patient is altered and unable to follow directions or speak to me.  Physical Exam  Calm elderly female, with advanced dementia.  Childlike.  Unable to express herself verbally.  Unable to follow commands.  Vital Signs: BP (!) 146/45 (BP Location: Left Arm)   Pulse (!) 105   Temp 97.7 F (36.5 C) (Oral)   Resp 18   Ht 5' 3"  (1.6 m)   Wt 81.9 kg (180 lb 8 oz)   SpO2 95%   BMI 31.97 kg/m  Pain Assessment: PAINAD   Pain Score: 0-No pain   SpO2: SpO2: 95 % O2 Device:SpO2: 95 % O2 Flow Rate: .   IO: Intake/output summary:  Intake/Output Summary (Last 24 hours) at 07/09/17 1540 Last data filed at 07/09/17 1133  Gross per 24 hour  Intake             1395 ml  Output                0 ml  Net             1395 ml    LBM: Last BM Date: 07/08/17 Baseline Weight: Weight: 81.9 kg (180 lb 8 oz) Most recent weight: Weight: 81.9 kg (180 lb 8 oz)     Palliative Assessment/Data:   Flowsheet Rows     Most Recent Value  Intake Tab  Referral Department  Hospitalist  Unit at Time of Referral  Oncology Unit  Palliative Care Primary Diagnosis  Neurology  Date Notified  07/09/17  Palliative Care Type   New Palliative care  Reason for referral  Clarify Goals of Care  Date  of Admission  07/07/17  Date first seen by Palliative Care  07/09/17  # of days Palliative referral response time  0 Day(s)  # of days IP prior to Palliative referral  2  Clinical Assessment  Palliative Performance Scale Score  20%  Psychosocial & Spiritual Assessment  Palliative Care Outcomes  Patient/Family meeting held?  Yes  Who was at the meeting?  patient, son Iona Beard, and dtr Ivin Booty (primary care taker)  Palliative Care Outcomes  Clarified goals of care, Counseled regarding hospice      Time In: 3:00 Time Out: 3:50 Time Total: 50 min. Greater than 50%  of this time was spent counseling and coordinating care related to the above assessment and plan.  Signed by: Florentina Jenny, PA-C Palliative Medicine Pager: [phone]  Please contact Palliative Medicine Team phone at [phone] for questions and concerns.  For individual provider: See Shea Evans

## 2017-07-09 NOTE — Evaluation (Signed)
Clinical/Bedside Swallow Evaluation Patient Details  Name: Cassandra Fuentes MRN: [id number] Date of Birth: December 18, 1928  Today's Date: 07/09/2017 Time: SLP Start Time (ACUTE ONLY): 1205 SLP Stop Time (ACUTE ONLY): 1305 SLP Time Calculation (min) (ACUTE ONLY): 60 min  Past Medical History:  Past Medical History:  Diagnosis Date  . Chronic kidney disease   . Dementia   . Diabetes mellitus without complication (H[facility])   . Glaucoma   . Hyperlipidemia   . Hypertension   . Hypothyroidism   . Hypothyroidism   . Macular degeneration   . Obstructive sleep apnea   . Urinary incontinence    Past Surgical History: History reviewed. No pertinent surgical history. HPI:  83 year old female admitted 8/201/8 with some drooling , mumbled/slurred speech, and AMS. PMH: dementia, CKD, DM, HTN, HLD, hypothyroid, OSA. Patient at baseline was ambulatory with a walker and had spontaneous speech although not always related to what was being discussed.  Incontinent.  Had a fall about 10 days ago and since that time has not been ambulatory; unsure of her other presenation of ADLs such as eating/drinking. Currently, pt is awaking more but requires constant stimulation; minimal attempts to phonate/communicate(muttered/mumbled movements); does not follow any commands and appeared agitated w/ oral stimluation from oral care.    Assessment / Plan / Recommendation Clinical Impression  Pt presented w/ severe oropharyngeal phase dysphagia during po trials of ice chip and (1/2 tsp) trials of Nectar liquids; pt appears at high risk for aspiration w/ oral intake at this time. Pt exhibited severely decreased oral awareness w/ boluses placed anteriorly in the mouth(behind front lower teeth). She made little attempt to respond to the boluses and prepare the boluses for A-P transfer for swallow. When pt eventually exhibited a swallow response, lingual protrusion was noted during the swallow effort(x2-3) w/ anterior leakage of the  bolus material. This is the opposite lingual movement needed during swallowing. Coughing followed the attempts at pharyngeal swallowing; suspect potential laryngeal penetration and/or aspiration. Multiple attempts at verbal/tactile cues to encourage swallowing however pt was unable to follow any commands. No further po trials attempted d/t pt's presentation. Recommend continue NPO status w/ frequent oral care to promote oral hygiene and stimluation to encourage swallowing. Family education given. NSG updated.  SLP Visit Diagnosis: Dysphagia, oropharyngeal phase (R13.12)    Aspiration Risk  Severe aspiration risk;Risk for inadequate nutrition/hydration    Diet Recommendation  NPO status; frequent oral care for hygiene and oral stimulation; aspiration precautions  Medication Administration: Via alternative means    Other  Recommendations Recommended Consults:  (Palliative care f/u) Oral Care Recommendations: Oral care QID;Staff/trained caregiver to provide oral care (while NPO status)   Follow up Recommendations  (TBD)      Frequency and Duration min 3x week  2 weeks       Prognosis Prognosis for Safe Diet Advancement: Guarded Barriers to Reach Goals: Cognitive deficits;Severity of deficits      Swallow Study   General Date of Onset: 07/07/17 HPI: 83 year old female admitted 8/201/8 with some drooling , mumbled/slurred speech, and AMS. PMH: dementia, CKD, DM, HTN, HLD, hypothyroid, OSA. Patient at baseline was ambulatory with a walker and had spontaneous speech although not always related to what was being discussed.  Incontinent.  Had a fall about 10 days ago and since that time has not been ambulatory; unsure of her other presenation of ADLs such as eating/drinking. Currently, pt is awaking more but requires constant stimulation; minimal attempts to phonate/communicate(muttered/mumbled movements); does  not follow any commands and appeared agitated w/ oral stimluation from oral care.   Type of Study: Bedside Swallow Evaluation Previous Swallow Assessment: none noted Diet Prior to this Study: NPO (suspect regular diet prior to this admission per family) Temperature Spikes Noted: Yes (temp has been elevated; wbc 12.7) Respiratory Status: Room air History of Recent Intubation: No Behavior/Cognition: Confused;Lethargic/Drowsy;Requires cueing;Doesn't follow directions Oral Cavity Assessment:  (difficult to determine d/t Cognitive status - fair) Oral Care Completed by SLP: Yes Oral Cavity - Dentition: Adequate natural dentition;Missing dentition Vision:  (n/a) Self-Feeding Abilities: Total assist Patient Positioning: Upright in bed Baseline Vocal Quality:  (nonverbal) Volitional Cough: Cognitively unable to elicit Volitional Swallow: Unable to elicit    Oral/Motor/Sensory Function Overall Oral Motor/Sensory Function:  (unable to assess d/t Cognitive status; lack of movements)   Ice Chips Ice chips: Impaired Presentation: Spoon (fed; 1 trial) Oral Phase Impairments: Reduced lingual movement/coordination;Poor awareness of bolus;Reduced labial seal Oral Phase Functional Implications: Prolonged oral transit;Oral residue;Oral holding Pharyngeal Phase Impairments:  (did not appreciate a pharyngeal swallow)   Thin Liquid Thin Liquid: Not tested    Nectar Thick Nectar Thick Liquid: Impaired Presentation: Spoon (fed; 2 trials) Oral Phase Impairments: Reduced labial seal;Reduced lingual movement/coordination;Poor awareness of bolus Oral phase functional implications: Prolonged oral transit;Oral residue;Oral holding (leakage during lingual protrusion during swallow) Pharyngeal Phase Impairments: Suspected delayed Swallow;Cough - Delayed;Cough - Immediate   Honey Thick Honey Thick Liquid: Not tested   Puree Puree: Not tested   Solid   GO   Solid: Not tested          Jerilynn Som, MS, CCC-SLP [name],[name] 07/09/2017,1:57 PM

## 2017-07-09 NOTE — Evaluation (Signed)
Occupational Therapy Evaluation Patient Details Name: Cassandra Fuentes MRN: [id number] DOB: 1928-12-21 Today's Date: 07/09/2017    History of Present Illness Pt is a 83 yo F, admitted to acute care with alterned mental status on 08/20. Prior to admission, pt required assistance for all ADL's, and amb with rollator. PMH: dementia, CKD, diabetes mellitus, glaucoma, HLD, HTN, hypothyroidism, macular degineration, obstructive sleep apnea, and urinary incontinence. Patient had a fall 10 days ago and has not been mobile since that time.    Clinical Impression   Patient was evaluated this date by OT, son present to provide history and baseline information.  He reports patient has lived in Assisted Living Memory Care unit for the last 3 years and is a PACE participant.  She was able to feed herself and could walk with her walker, take herself to the bathroom but required assistance for most other ADLs and IADLs.  She had a fall 10 days ago and has not be ambulatory since.  She was alert this date and could follow some simple one step commands with therapist providing cues.  Patient now presents with decreased initiation of LUE use, muscle weakness, decreased ability to perform transfers, functional mobility, decreased ability to perform self care tasks especially with feeding and toileting.  She does present with swallowing issues and currently NPO.  Patient would likely benefit from skilled OT to increase independence in familiar tasks.  Recommend STR upon discharge. Due to advanced dementia, patient will not be able to perform new learning but may improve with familiar, automatic routine tasks.    Follow Up Recommendations  SNF    Equipment Recommendations       Recommendations for Other Services       Precautions / Restrictions Precautions Precautions: Fall Restrictions Weight Bearing Restrictions: No      Mobility Bed Mobility Overal bed mobility: Needs Assistance Bed Mobility:  Rolling;Supine to Sit Rolling: Mod assist   Supine to sit: Max assist;+2 for physical assistance     General bed mobility comments: per PT  Transfers                 General transfer comment: Deferred due to safety concerns    Balance Overall balance assessment: Needs assistance Sitting-balance support: Bilateral upper extremity supported;Feet supported Sitting balance-Leahy Scale: Poor                                     ADL either performed or assessed with clinical judgement   ADL Overall ADL's : Needs assistance/impaired Eating/Feeding: NPO Eating/Feeding Details (indicate cue type and reason): Patient with swallowing difficulties at this time.  She was previously independent in self feeding. Grooming: Brushing hair;Minimal assistance;Cueing for sequencing Grooming Details (indicate cue type and reason): Patient able to respond to mirroring from therapist, cues and occasional hand over hand assist. Upper Body Bathing: Moderate assistance   Lower Body Bathing: Total assistance   Upper Body Dressing : Maximal assistance   Lower Body Dressing: Total assistance     Toilet Transfer Details (indicate cue type and reason): Not performed this date but will likely require +2 for toileting           General ADL Comments: Son present to provide some history, he reports his sister knows more of patients prior level.  He does know she uses a walker at baseline and could feed herself. Son reports patient has had several falls  in the last 6 months.      Vision         Perception     Praxis      Pertinent Vitals/Pain Pain Assessment: No/denies pain Pain Score: 0-No pain     Hand Dominance Right   Extremity/Trunk Assessment Upper Extremity Assessment Upper Extremity Assessment: Difficult to assess due to impaired cognition   Lower Extremity Assessment Lower Extremity Assessment: Difficult to assess due to impaired cognition        Communication Communication Communication:  (patient states, "I don't know" and "okay")   Cognition Arousal/Alertness: Awake/alert Behavior During Therapy: Flat affect Overall Cognitive Status: Impaired/Different from baseline                                 General Comments: Patient able to follow one step commands at times, she was able to respond to tactile cues and mirroring from therapist for simple tasks such as brushing hair and washing face.    General Comments       Exercises     Shoulder Instructions      Home Living Family/patient expects to be discharged to:: Assisted living (Has been in memory care for the last 3 years)                             Home Equipment: Walker - 4 wheels   Additional Comments: [facility]: memory care unit. Amb with rollator at baseline. Per pt daughters report, pt had a fall 2 weeks ago, resulting in cut to eye, and has not been amb since.       Prior Functioning/Environment Level of Independence: Needs assistance        Comments: Pt poor historian due to advanced dementia. Son present to provide pt history. Pt required assist with all ADL's/IADL's, she was able to feed herself and she could walk to the bathroom but required assist for hygiene and enjoyed walking prior to admission        OT Problem List: Decreased strength;Impaired balance (sitting and/or standing);Decreased cognition;Decreased range of motion;Decreased safety awareness;Decreased coordination;Impaired UE functional use      OT Treatment/Interventions: Self-care/ADL training;Therapeutic activities;Balance training;Therapeutic exercise;Patient/family education    OT Goals(Current goals can be found in the care plan section) Acute Rehab OT Goals Patient Stated Goal: unable to state, son reports he would like patient to do as much as she is able OT Goal Formulation: With patient/family Time For Goal Achievement: 07/18/17 Potential to  Achieve Goals: Good  OT Frequency: Min 1X/week   Barriers to D/C:            Co-evaluation              AM-PAC PT "6 Clicks" Daily Activity     Outcome Measure                 End of Session    Activity Tolerance: Patient limited by fatigue Patient left: in bed;with call bell/phone within reach;with family/visitor present;with bed alarm set  OT Visit Diagnosis: Muscle weakness (generalized) (M62.81);Feeding difficulties (R63.3);Cognitive communication deficit (R41.841)                Time: 4098-1191 OT Time Calculation (min): 19 min Charges:  OT General Charges $OT Visit: 1 Procedure OT Evaluation $OT Eval Moderate Complexity: 1 Procedure G-Codes:     Amy T Lovett, OTR/L, CLT  Lovett,Amy 07/09/2017, 5:58 PM

## 2017-07-09 NOTE — NC FL2 (Signed)
[place] MEDICAID FL2 LEVEL OF CARE SCREENING TOOL     IDENTIFICATION  Patient Name: Cassandra Fuentes Birthdate: [birth year] Sex: female Admission Date (Current Location): 07/07/2017  [place] and [place] Number:  [address] [id number] [facility] Facility and Address:  [facility], [address] [place], [place] [address]      Provider Number: [id number]  Attending Physician Name and Address:  Milagros Loll, MD  Relative Name and Phone Number:  Venda Rodes Daughter [phone]     Current Level of Care: Hospital Recommended Level of Care: Skilled Nursing Facility Prior Approval Number:    Date Approved/Denied:   PASRR Number:  [id number]  Discharge Plan: SNF    Current Diagnoses: Patient Active Problem List   Diagnosis Date Noted  . Altered mental status 07/08/2017    Orientation RESPIRATION BLADDER Height & Weight    oriented to person    Normal Incontinent Weight: 180 lb 8 oz (81.9 kg) Height:  5\' 3"  (160 cm)  BEHAVIORAL SYMPTOMS/MOOD NEUROLOGICAL BOWEL NUTRITION STATUS      Incontinent Diet (NPO)  AMBULATORY STATUS COMMUNICATION OF NEEDS Skin   Limited Assist Verbally Surgical wounds                       Personal Care Assistance Level of Assistance  Bathing, Feeding, Dressing Bathing Assistance: Limited assistance Feeding assistance: Limited assistance Dressing Assistance: Limited assistance     Functional Limitations Info  Sight, Hearing, Speech Sight Info: Adequate Hearing Info: Adequate Speech Info: Adequate    SPECIAL CARE FACTORS FREQUENCY  PT (By licensed PT)     PT Frequency: 5x a week              Contractures Contractures Info: Not present    Additional Factors Info  Code Status, Allergies, Insulin Sliding Scale Code Status Info: DNR Allergies Info: PENICILLINS, TELEPAQUE IOPANOIC ACID, MORPHINE AND RELATED    Insulin Sliding Scale Info: insulin aspart (novoLOG) injection 0-15 Units every  4 hours       Current Medications (07/09/2017):  This is the current hospital active medication list Current Facility-Administered Medications  Medication Dose Route Frequency Provider Last Rate Last Dose  . 0.9 %  sodium chloride infusion   Intravenous Continuous Hugelmeyer, Alexis, DO 75 mL/hr at 07/09/17 1024    . acetaminophen (TYLENOL) tablet 650 mg  650 mg Oral Q4H PRN Hugelmeyer, Alexis, DO       Or  . acetaminophen (TYLENOL) solution 650 mg  650 mg Per Tube Q4H PRN Hugelmeyer, Alexis, DO       Or  . acetaminophen (TYLENOL) suppository 650 mg  650 mg Rectal Q4H PRN Hugelmeyer, Alexis, DO   650 mg at 07/08/17 1205  . aspirin suppository 300 mg  300 mg Rectal Daily Thana Farr, MD   300 mg at 07/09/17 1049  . brimonidine (ALPHAGAN) 0.15 % ophthalmic solution 1 drop  1 drop Both Eyes BID Hugelmeyer, Alexis, DO   1 drop at 07/09/17 1050  . enoxaparin (LOVENOX) injection 40 mg  40 mg Subcutaneous Q24H Hugelmeyer, Alexis, DO   40 mg at 07/08/17 2147  . fluticasone (FLONASE) 50 MCG/ACT nasal spray 1 spray  1 spray Each Nare Daily Hugelmeyer, Alexis, DO   1 spray at 07/09/17 1050  . insulin aspart (novoLOG) injection 0-15 Units  0-15 Units Subcutaneous Q4H Hugelmeyer, Alexis, DO   2 Units at 07/09/17 1231  . meropenem (MERREM) 1 g in sodium chloride 0.9 % 100 mL IVPB  1 g Intravenous Q12H Auburn Bilberry, MD   Stopped at 07/09/17 1133  . ondansetron (ZOFRAN) injection 4 mg  4 mg Intravenous Q6H PRN Ihor Austin, MD   4 mg at 07/08/17 0512  . senna-docusate (Senokot-S) tablet 1 tablet  1 tablet Oral QHS PRN Hugelmeyer, Alexis, DO      . sodium chloride 0.9 % bolus 1,000 mL  1,000 mL Intravenous Once Triplett, Cari B, FNP      . trolamine salicylate (ASPERCREME) 10 % cream   Topical BID Hugelmeyer, Alexis, DO         Discharge Medications: Please see discharge summary for a list of discharge medications.  Relevant Imaging Results:  Relevant Lab Results:   Additional  Information SSN [id number]  Dede Query, LCSW

## 2017-07-10 DIAGNOSIS — G308 Other Alzheimer's disease: Secondary | ICD-10-CM

## 2017-07-10 DIAGNOSIS — F0280 Dementia in other diseases classified elsewhere without behavioral disturbance: Secondary | ICD-10-CM

## 2017-07-10 DIAGNOSIS — G309 Alzheimer's disease, unspecified: Secondary | ICD-10-CM

## 2017-07-10 LAB — URINE CULTURE: Culture: >=100000 — AB

## 2017-07-10 LAB — GLUCOSE, CAPILLARY
GLUCOSE-CAPILLARY: 97 mg/dL (ref 65–99)
GLUCOSE-CAPILLARY: 89 mg/dL (ref 65–99)
GLUCOSE-CAPILLARY: 116 mg/dL — AB (ref 65–99)
Glucose-Capillary: 102 mg/dL — ABNORMAL HIGH (ref 65–99)
Glucose-Capillary: 104 mg/dL — ABNORMAL HIGH (ref 65–99)

## 2017-07-10 NOTE — Progress Notes (Signed)
[employer] [facility] [place] [facility]                                                                                                                                                                                  Patient Demographics   Cassandra Fuentes, is a 83 y.o. female, DOB - 1929-09-30, ZOX:[id number]  Admit date - 07/07/2017   Admitting Physician Tonye Royalty, DO  Outpatient Primary MD for the patient is [facility]   LOS - 2  Subjective:  Patient admitted with altered mental status according to family and her primary care provider she has advanced dementia Patient normally able to communicate somewhat and able to eat.  She is more awake but non verbal. Following some instructions Family at bedside  Review of Systems:   CONSTITUTIONAL: Confused has advanced dementia  Vitals:   Vitals:   07/09/17 1324 07/09/17 1926 07/10/17 0340 07/10/17 0340  BP: (!) 146/45 (!) 154/54 (!) 160/61   Pulse: (!) 105 79 (!) 43   Resp: 18 18  20   Temp: 97.7 F (36.5 C)  98.2 F (36.8 C)   TempSrc: Oral  Oral   SpO2: 95% 100% 97%   Weight:      Height:        Wt Readings from Last 3 Encounters:  07/08/17 81.9 kg (180 lb 8 oz)  06/24/15 79.4 kg (175 lb)     Intake/Output Summary (Last 24 hours) at 07/10/17 8119 Last data filed at 07/10/17 0300  Gross per 24 hour  Intake          2541.25 ml  Output              250 ml  Net          2291.25 ml    Physical Exam:   GENERAL:Elderly chronically ill appearing HEAD, EYES, EARS, NOSE AND THROAT: Atraumatic, normocephalic. Extraocular muscles are intact. Pupils equal and reactive to light. Sclerae anicteric. No conjunctival injection. No oro-pharyngeal erythema.  NECK: Supple. There is no jugular venous distention. No bruits, no lymphadenopathy, no thyromegaly.  HEART: Regular rate and rhythm,. No murmurs, no rubs, no clicks.  LUNGS: Clear to auscultation bilaterally. No rales or rhonchi. No wheezes.   ABDOMEN: Soft, flat, nontender, nondistended. Has good bowel sounds. No hepatosplenomegaly appreciated.  EXTREMITIES: No evidence of any cyanosis, clubbing, or peripheral edema.  +2 pedal and radial pulses bilaterally.  NEUROLOGIC: Patient not following instructions. No falccidity or rigidity. SKIN: Moist and warm with no rashes appreciated.  Psych: Not anxious, depressed LN: No inguinal LN enlargement    Antibiotics   Anti-infectives    Start     Dose/Rate  Route Frequency Ordered Stop   07/10/17 1000  levofloxacin (LEVAQUIN) IVPB 750 mg  Status:  Discontinued     750 mg 100 mL/hr over 90 Minutes Intravenous Every 48 hours 07/08/17 0603 07/08/17 1323   07/09/17 1000  levofloxacin (LEVAQUIN) IVPB 750 mg  Status:  Discontinued     750 mg 100 mL/hr over 90 Minutes Intravenous Every 24 hours 07/08/17 0404 07/08/17 0603   07/08/17 1330  meropenem (MERREM) 1 g in sodium chloride 0.9 % 100 mL IVPB     1 g 200 mL/hr over 30 Minutes Intravenous Every 12 hours 07/08/17 1323     07/08/17 0415  levofloxacin (LEVAQUIN) IVPB 750 mg     750 mg 100 mL/hr over 90 Minutes Intravenous  Once 07/08/17 0404 07/08/17 0854      Medications   Scheduled Meds: . aspirin  300 mg Rectal Daily  . brimonidine  1 drop Both Eyes BID  . enoxaparin (LOVENOX) injection  40 mg Subcutaneous Q24H  . fluticasone  1 spray Each Nare Daily  . insulin aspart  0-15 Units Subcutaneous Q4H  . trolamine salicylate   Topical BID   Continuous Infusions: . sodium chloride 75 mL/hr at 07/10/17 0100  . meropenem (MERREM) IV Stopped (07/09/17 2148)  . sodium chloride     PRN Meds:.acetaminophen **OR** acetaminophen (TYLENOL) oral liquid 160 mg/5 mL **OR** acetaminophen, ondansetron (ZOFRAN) IV, senna-docusate   Data Review:   Micro Results Recent Results (from the past 240 hour(s))  Urine culture     Status: Abnormal (Preliminary result)   Collection Time: 07/07/17  9:05 PM  Result Value Ref Range Status   Specimen  Description URINE, RANDOM  Final   Special Requests NONE  Final   Culture >=100,000 COLONIES/mL ESCHERICHIA COLI (A)  Final   Report Status PENDING  Incomplete  MRSA PCR Screening     Status: None   Collection Time: 07/08/17  5:17 AM  Result Value Ref Range Status   MRSA by PCR NEGATIVE NEGATIVE Final    Comment:        The GeneXpert MRSA Assay (FDA approved for NASAL specimens only), is one component of a comprehensive MRSA colonization surveillance program. It is not intended to diagnose MRSA infection nor to guide or monitor treatment for MRSA infections.     Radiology Reports Dg Abd Portable 1v  Result Date: 07/08/2017 CLINICAL DATA:  Bowel obstruction. EXAM: PORTABLE ABDOMEN - 1 VIEW COMPARISON:  CT 04/16/2006 . FINDINGS: Surgical clips right upper quadrant. No evidence of bowel distention or free air. Questionable small stone noted projected over the right kidney. Aortic atherosclerosis. Degenerative changes thoracolumbar spine. Lower pelvis not imaged. IMPRESSION: 1.  No evidence of bowel distention or free air. 2.  Questionable small stone noted projected over the right kidney . Electronically Signed   By: Maisie Fus  Register   On: 07/08/2017 06:01   Ct Head Code Stroke Wo Contrast  Result Date: 07/07/2017 CLINICAL DATA:  Code stroke. 83 y/o F; garbled speech. Unable to move legs. EXAM: CT HEAD WITHOUT CONTRAST TECHNIQUE: Contiguous axial images were obtained from the base of the skull through the vertex without intravenous contrast. COMPARISON:  06/24/2015 CT head FINDINGS: Brain: No evidence of acute infarction, hemorrhage, hydrocephalus, extra-axial collection or mass lesion/mass effect. Stable advanced chronic microvascular ischemic changes and parenchymal volume loss of the brain. Stable chronic lacunar infarcts within left caudate head, left lentiform nucleus, and left hemi pons. Vascular: Severe calcific atherosclerosis of carotid siphons and calcified plaques of  the M1  segments. 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 Skull: Normal. Negative for fracture or focal lesion.The Sinuses/Orbits: No acute finding. Other: None. ASPECTS [facility] Stroke Program Early CT Score) - Ganglionic level infarction (caudate, lentiform nuclei, internal capsule, insula, M1-M3 cortex): 7 - Supraganglionic infarction (M4-M6 cortex): 3 Total score (0-10 with 10 being normal): 10 IMPRESSION: 1. No acute intracranial abnormality identified. 2. Stable advanced chronic microvascular ischemic changes and parenchymal volume loss of the brain. 3. ASPECTS is 10 These results were called by telephone at the time of interpretation on 07/07/2017 at 8:11 pm to Dr. Roxan Hockey, who verbally acknowledged these results. Electronically Signed   By: Mitzi Hansen M.D.   On: 07/07/2017 20:12     CBC  Recent Labs Lab 07/07/17 1932 07/08/17 0312  WBC 11.1* 12.7*  HGB 12.4 11.6*  HCT 37.5 35.2  PLT 355 332  MCV 89.4 86.9  MCH 29.5 28.8  MCHC 33.0 33.1  RDW 12.9 12.7  LYMPHSABS 2.5  --   MONOABS 1.5*  --   EOSABS 0.3  --   BASOSABS 0.1  --     Chemistries   Recent Labs Lab 07/07/17 1932 07/08/17 0312  NA 141  --   K 3.6  --   CL 100*  --   CO2 31  --   GLUCOSE 179*  --   BUN 18  --   CREATININE 0.77 0.84  CALCIUM 9.2  --   AST 20  --   ALT 13*  --   ALKPHOS 78  --   BILITOT 0.5  --    ------------------------------------------------------------------------------------------------------------------ estimated creatinine clearance is 48.7 mL/min (by C-G formula based on SCr of 0.84 mg/dL). ------------------------------------------------------------------------------------------------------------------ No results for input(s): HGBA1C in the last 72 hours. ------------------------------------------------------------------------------------------------------------------ No results for input(s): CHOL, HDL, LDLCALC, TRIG, CHOLHDL, LDLDIRECT in the last 72  hours. ------------------------------------------------------------------------------------------------------------------ No results for input(s): TSH, T4TOTAL, T3FREE, THYROIDAB in the last 72 hours.  Invalid input(s): FREET3 ------------------------------------------------------------------------------------------------------------------ No results for input(s): VITAMINB12, FOLATE, FERRITIN, TIBC, IRON, RETICCTPCT in the last 72 hours.  Coagulation profile No results for input(s): INR, PROTIME in the last 168 hours.  No results for input(s): DDIMER in the last 72 hours.  Cardiac Enzymes  Recent Labs Lab 07/07/17 1932 07/08/17 0312  TROPONINI <0.03 <0.03   ------------------------------------------------------------------------------------------------------------------ Invalid input(s): POCBNP    Assessment & Plan   This is a 83 y.o. female with a history of CKD, dementia, diabetes, hypertension, hyperlipidemia, hypothyroidism, obstructive sleep apnea now being admitted with:  #. AMS, likely multifactorial;Including due to UTI and possible CVA patient's daughter  would not like an echo carotid Dopplers MRI etc.  stroke workup cancelled ON IV abx to see if patient improves  #. UTI ON meropenem. Wait for cx and sensitivity  # Dysphagia Family doesn't want PEG Will need hospice home if unable to swallow tomorrow  #. Immobility s/p fall - PT eval once awake  #. History of CK D, chronic and stable -Monitor BMP  #. History of hypertension - Blood pressure medications on hold  #. History of hyperlipidemia - Hold by mouth Zocor for now  #. History of dementia/depression - Hold by mouth Paxil for now  #. History of hypothyroidism - Hold by mouth Synthroid for now  #. H/o Diabetes - Accuchecks q4h with RISS coverage - Heart healthy, carb controlled diet     Code Status Orders        Start     Ordered   07/08/17 0323  Do  not attempt resuscitation  (DNR)  Continuous    Question Answer Comment  In the event of cardiac or respiratory ARREST Do not call a "code blue"   In the event of cardiac or respiratory ARREST Do not perform Intubation, CPR, defibrillation or ACLS   In the event of cardiac or respiratory ARREST Use medication by any route, position, wound care, and other measures to relive pain and suffering. May use oxygen, suction and manual treatment of airway obstruction as needed for comfort.      07/08/17 0323    Code Status History    Date Active Date Inactive Code Status Order ID Comments User Context   07/08/2017  2:34 AM 07/08/2017  3:23 AM Full Code [id number]  Hugelmeyer, Jon Gills, DO Inpatient    Advance Directive Documentation     Most Recent Value  Type of Advance Directive  Out of facility DNR (pink MOST or yellow form)  Pre-existing out of facility DNR order (yellow form or pink MOST form)  -  "MOST" Form in Place?  -      Consults none  DVT Prophylaxis  Lovenox  Lab Results  Component Value Date   PLT 332 07/08/2017     Time Spent in minutes   Greater than 50% of time spent in care coordination and counseling patient regarding the condition and plan of care.   Milagros Loll R M.D on 07/10/2017 at 7:09 AM  Between 7am to 6pm - Pager - [phone]  After 6pm go to [email or web address] - password [email or web address] [facility]  [facility]   Office  [phone]

## 2017-07-10 NOTE — Progress Notes (Signed)
Daily Progress Note   Patient Name: Cassandra Fuentes       Date: 07/10/2017 DOB: [birth year]  Age: 83 y.o. MRN#: [id number] Attending Physician: Milagros Loll, MD Primary Care Physician: [facility] Admit Date: 07/07/2017  Reason for Consultation/Follow-up: Establishing goals of care  Subjective: Patient appears comfortable, smiles and grunts to some of my comments.  Family appears from around the corner as I'm leaving and we sit down to talk.  Mrs. Groleau did minimally better with speech therapy today.  We question whether or not she will do even better tomorrow.  The family and I discussed Hospice House vs PACE/SNF on discharge.    Family expressed concerned that patient would be discharged quickly before a plan could be put together.  I assured them that a safe appropriate discharge plan would be in place before she leaves the hospital.  I then spoke with the patient's PACE provider, Haydee Salter, NP.  I explained that the family's preference at discharge is to go to [facility], but the patient may or may not be eligible.  Ms. Shon Baton graciously offered to come to the hospital to speak with the family about PACE services at [facility] and answer any questions the family may have.   Assessment: 83 yo female with advanced dementia and ESBL UTI.  Meropenem started 8/21.  Speech therapy continuing to work with patient.   She is FAST 7D in that she has a very limited ability to speak, is unable to walk and does not have the ability to sit up on her own.   At this point she is taking only a few sips.  Patient Profile/HPI:  83 y.o. female  with past medical history of dementia, DM, OSA and chronic kidney disease who was admitted on 07/07/2017 with altered mental status, drooling and  garbled speech.  She was felt to have had a stroke.  Urinalysis showed ecoli UTI.  Family prefers not to go thru aggressive stroke work up as they feel it will not change the end results.  During her initial speech evaluation patient was unable to take POs .     Length of Stay: 2  Current Medications: Scheduled Meds:  . aspirin  300 mg Rectal Daily  . brimonidine  1 drop Both Eyes BID  . enoxaparin (LOVENOX) injection  40 mg Subcutaneous Q24H  . fluticasone  1 spray Each Nare Daily  . insulin aspart  0-15 Units Subcutaneous Q4H  . trolamine salicylate   Topical BID    Continuous Infusions: . sodium chloride 75 mL/hr at 07/10/17 1149  . meropenem (MERREM) IV Stopped (07/10/17 1033)  . sodium chloride      PRN Meds: acetaminophen **OR** acetaminophen (TYLENOL) oral liquid 160 mg/5 mL **OR** acetaminophen, ondansetron (ZOFRAN) IV, senna-docusate  Physical Exam        Well developed pleasantly demented female.  Unable to speak words to me.  Unable to follow commands. Appears comfortable No respiratory distress  Vital Signs: BP (!) 160/61 (BP Location: Left Arm)   Pulse (!) 43   Temp 98.2 F (36.8 C) (Oral)   Resp 20   Ht 5\' 3"  (1.6 m)   Wt 81.9 kg (180 lb 8 oz)   SpO2 97%   BMI 31.97 kg/m  SpO2: SpO2: 97 % O2 Device: O2 Device: Not Delivered O2 Flow Rate: O2 Flow Rate (L/min): 0 L/min  Intake/output summary:  Intake/Output Summary (Last 24 hours) at 07/10/17 1243 Last data filed at 07/10/17 0300  Gross per 24 hour  Intake          2341.25 ml  Output              250 ml  Net          2091.25 ml   LBM: Last BM Date: 07/08/17 Baseline Weight: Weight: 81.9 kg (180 lb 8 oz) Most recent weight: Weight: 81.9 kg (180 lb 8 oz)       Palliative Assessment/Data:    Flowsheet Rows     Most Recent Value  Intake Tab  Referral Department  Hospitalist  Unit at Time of Referral  Oncology Unit  Palliative Care Primary Diagnosis  Neurology  Date Notified  07/09/17    Palliative Care Type  New Palliative care  Reason for referral  Clarify Goals of Care  Date of Admission  07/07/17  Date first seen by Palliative Care  07/09/17  # of days Palliative referral response time  0 Day(s)  # of days IP prior to Palliative referral  2  Clinical Assessment  Palliative Performance Scale Score  20%  Psychosocial & Spiritual Assessment  Palliative Care Outcomes  Patient/Family meeting held?  Yes  Who was at the meeting?  patient, son Greggory Stallion, and dtr Jasmine December (primary care taker)  Palliative Care Outcomes  Clarified goals of care, Counseled regarding hospice      Patient Active Problem List   Diagnosis Date Noted  . Acute cystitis without hematuria   . Palliative care encounter   . Encounter for hospice care discussion   . Altered mental status 07/08/2017    Palliative Care Plan    Recommendations/Plan:  Family wants IV antibiotic therapy for ESBL UTI as appropriate.  Otherwise they want their mother to be comfortable.  Prefer Hospice House on DC.  At this point she is only eating sips and is likely eligible.    I have contacted Haydee Salter NP (PACE provider) to speak with family about options for discharge thru PACE   I recommend discontinuing IVF to give [place] a clearer picture of how the patient is doing without artificial support.  Goals of Care and Additional Recommendations:  Limitations on Scope of Treatment: Full Comfort Care other than IV antibiotics.  Code Status:  DNR  Prognosis:  Weeks.  Patient with recurrent antibiotic resistant UTIs, Advanced  dementia with very limited ability to speak or take PO.    Discharge Planning:  Hospice facility  Care plan was discussed with Dr. Elpidio Anis, Haydee Salter, NP with PACE and family.  Thank you for allowing the Palliative Medicine Team to assist in the care of this patient.  Total time spent:  45 min.     Greater than 50%  of this time was spent counseling and coordinating care related to  the above assessment and plan.  Norvel Richards, PA-C Palliative Medicine  Please contact Palliative MedicineTeam phone at [phone] for questions and concerns between 7 am - 7 pm.   Please see AMION for individual provider pager numbers.

## 2017-07-10 NOTE — Progress Notes (Signed)
Pharmacy Antibiotic Note  Cassandra Fuentes is a 83 y.o. female admitted on 07/07/2017 with UTI.  Pharmacy was originally consulted for Levaquin dosing. Pharmacy now consulted for meropenem dosing. MD reports patients outpatient UCx showed ESBL E. Coli.   Plan: 8/21  Will discontinue levofloxacin 750mg  Will start Meropenem 1g IV every 12 hours based on current CrCl <39ml/min. Will monitor renal function and adjust dose as needed.   8/23 UCx resulted as Ecoli ESBL. Will continue patient on meropenem 1g Q12H.   Height: 5\' 3"  (160 cm) Weight: 180 lb 8 oz (81.9 kg) IBW/kg (Calculated) : 52.4  Temp (24hrs), Avg:98.5 F (36.9 C), Min:98.2 F (36.8 C), Max:98.8 F (37.1 C)   Recent Labs Lab 07/07/17 1932 07/08/17 0312  WBC 11.1* 12.7*  CREATININE 0.77 0.84    Results for orders placed or performed during the hospital encounter of 07/07/17  Urine culture     Status: Abnormal   Collection Time: 07/07/17  9:05 PM  Result Value Ref Range Status   Specimen Description URINE, RANDOM  Final   Special Requests NONE  Final   Culture (A)  Final    >=100,000 COLONIES/mL ESCHERICHIA COLI Confirmed Extended Spectrum Beta-Lactamase Producer (ESBL) Performed at [facility], [address] [place], [place] [address]    Report Status 07/10/2017 FINAL  Final   Organism ID, Bacteria ESCHERICHIA COLI (A)  Final      Susceptibility   Escherichia coli - MIC*    AMPICILLIN >=32 RESISTANT Resistant     CEFAZOLIN >=64 RESISTANT Resistant     CEFTRIAXONE >=64 RESISTANT Resistant     CIPROFLOXACIN >=4 RESISTANT Resistant     GENTAMICIN <=1 SENSITIVE Sensitive     IMIPENEM <=0.25 SENSITIVE Sensitive     NITROFURANTOIN <=16 SENSITIVE Sensitive     TRIMETH/SULFA <=20 SENSITIVE Sensitive     AMPICILLIN/SULBACTAM >=32 RESISTANT Resistant     PIP/TAZO 16 SENSITIVE Sensitive     Extended ESBL POSITIVE Resistant     * >=100,000 COLONIES/mL ESCHERICHIA COLI  MRSA PCR Screening     Status: None   Collection Time: 07/08/17  5:17 AM  Result Value Ref Range Status   MRSA by PCR NEGATIVE NEGATIVE Final    Comment:        The GeneXpert MRSA Assay (FDA approved for NASAL specimens only), is one component of a comprehensive MRSA colonization surveillance program. It is not intended to diagnose MRSA infection nor to guide or monitor treatment for MRSA infections.     Estimated Creatinine Clearance: 48.7 mL/min (by C-G formula based on SCr of 0.84 mg/dL).    Allergies  Allergen Reactions  . Penicillins Hives  . Telepaque [Iopanoic Acid] Other (See Comments)    Unknown reaction  . Morphine And Related Nausea And Vomiting    Antimicrobials this admission: Levaquin 8/21 >> 8/21 Meropenem 8/21 >>   Dose adjustments this admission:   Microbiology results: 8/20 UCx: Ecoli ESBL  8/20 EC:[id number]: many  LE(+) NO2(+) WBC TNTC  Thank you for allowing pharmacy to be a part of this patient's care.  Yolanda Bonine, PharmD Pharmacy Resident  07/10/2017 3:51 PM

## 2017-07-10 NOTE — Progress Notes (Signed)
  Speech Language Pathology Treatment: Dysphagia  Patient Details Name: Cassandra Fuentes MRN: [id number] DOB: [birth year] Today's Date: 07/10/2017 Time: 2094-7096 SLP Time Calculation (min) (ACUTE ONLY): 45 min  Assessment / Plan / Recommendation Clinical Impression  Pt seen for ongoing assessment for safe toleration of po intake. Pt much more alert and exhibiting oral awareness this morning, however, remains mostly nonverbal and does not follow commands. She appeared to fatigue quickly w/ the exertion of po trials - an increased risk for aspiration for pt.  Pt was given 1/2-3/4 TSP trials of puree and Honey consistency liquids (~10 trials total) w/ pt demonstrating oral holding, decreased lingual/labial movements for bolus management, slow and deliberate A-P transfer, lingual protrusion during the swallowing resulting in anterior spillage of bolus material(min less today), and bolus residue moreso on Right side of mouth/buccal area. Pt exhibited multiple swallowing (possible in attempts to clear pharyngeal residue?) but no immediate, overt coughing noted w/ po trials given. Max verbal/tactile/model cues were given to encourage oropharyngeal phases of swallowing by SLP. As pt appeared to fatigue, no further trials were given.  As pt continues to present w/ increased risk for aspiration, no formal oral diet is recommended at this time by SLP. To continue to present pt w/ swallowing exposure, swallowing exercises in the form of few tsps trials of puree and Honey consistency liquids w/ strict aspiration precautions by Nursing only 2-3x shift - stop if any increased s/s of aspiration noted. Education given to Son on dysphagia; risk for aspiration; impact on overall nutritional intake; strategies to enhance swallowing; swallowing exericise w/ po trials. ST services will continue to f/u w/ pt's status and ongoing assessment to establish po diet if able. Son agreed; NSG/MD educated/updated.     HPI HPI: 83 year  old female admitted 8/201/8 with some drooling , mumbled/slurred speech, and AMS. PMH: dementia, CKD, DM, HTN, HLD, hypothyroid, OSA. Patient at baseline was ambulatory with a walker and had spontaneous speech although not always related to what was being discussed.  Incontinent.  Had a fall about 10 days ago and since that time has not been ambulatory; unsure of her other presenation of ADLs such as eating/drinking. Currently today, pt is more awake and requires less stimulation; minimal attempts to phonate/communicate(muttered/mumbled movements) still. Pt does not follow any commands and appeared defensive w/ oral stimluation from oral care. She appeared to exhibit min more oral awareness w/ po tasks today.        SLP Plan  Continue with current plan of care       Recommendations  Diet recommendations: NPO (w/ trials w/ NSG for swallowing exercise) Liquids provided via: Teaspoon Medication Administration: Via alternative means Supervision: Full supervision/cueing for compensatory strategies;Trained caregiver to feed patient Compensations: Minimize environmental distractions;Slow rate;Small sips/bites;Lingual sweep for clearance of pocketing;Multiple dry swallows after each bite/sip Postural Changes and/or Swallow Maneuvers: Seated upright 90 degrees;Upright 30-60 min after meal                General recommendations:  (Palliative Care following) Oral Care Recommendations: Oral care QID;Staff/trained caregiver to provide oral care Follow up Recommendations: Skilled Nursing facility (TBD) SLP Visit Diagnosis: Dysphagia, oropharyngeal phase (R13.12) Plan: Continue with current plan of care       GO                Cassandra Som, MS, CCC-SLP Cassandra Fuentes,Cassandra Fuentes 07/10/2017, 10:49 AM

## 2017-07-10 NOTE — Progress Notes (Signed)
Nutrition Brief Note  Patient identified to be seen for low Braden score. Chart reviewed. Patient now transitioning to comfort care. She is still receiving IV antibiotics for ESBL UTI, but otherwise is full comfort care.  SLP recommends patient remain NPO. No nutrition interventions warranted at this time. Please consult RD as needed.  Helane Rima, MS, RD, LDN Pager: [phone] After Hours Pager: [phone]

## 2017-07-11 LAB — GLUCOSE, CAPILLARY
GLUCOSE-CAPILLARY: 102 mg/dL — AB (ref 65–99)
GLUCOSE-CAPILLARY: 116 mg/dL — AB (ref 65–99)
Glucose-Capillary: 106 mg/dL — ABNORMAL HIGH (ref 65–99)

## 2017-07-11 MED ORDER — LORAZEPAM 2 MG/ML PO CONC: 1.0000 mg | Freq: Four times a day (QID) | ORAL | 0 refills | Status: AC | PRN

## 2017-07-11 MED ORDER — LORAZEPAM 0.5 MG PO TABS: 0.5000 mg | ORAL_TABLET | Freq: Four times a day (QID) | ORAL | 0 refills | Status: DC | PRN

## 2017-07-11 MED ORDER — LORAZEPAM 2 MG/ML PO CONC
0.5000 mg | Freq: Four times a day (QID) | ORAL | Status: DC | PRN
  Administered 2017-07-11 (×2): 0.5 mg via ORAL
  Filled 2017-07-11 (×2): qty 1

## 2017-07-11 MED ORDER — MORPHINE SULFATE (CONCENTRATE) 10 MG/0.5ML PO SOLN
10.0000 mg | ORAL | Status: DC | PRN
  Administered 2017-07-11: 15:00:00 10 mg via ORAL
  Filled 2017-07-11: qty 1

## 2017-07-11 MED ORDER — LORAZEPAM 0.5 MG PO TABS
0.5000 mg | ORAL_TABLET | Freq: Four times a day (QID) | ORAL | Status: DC | PRN
  Filled 2017-07-11: qty 1

## 2017-07-11 MED ORDER — MORPHINE SULFATE (CONCENTRATE) 10 MG /0.5 ML PO SOLN: 10.0000 mg | ORAL | 0 refills | Status: AC | PRN

## 2017-07-11 NOTE — Progress Notes (Addendum)
[facility]                                                                                                                                                                                  Patient Demographics   Cassandra Fuentes, is a 83 y.o. female, DOB - 09-06-1929, RAQ:[id number]  Admit date - 07/07/2017   Admitting Physician Harvie Bridge, DO  Outpatient Primary MD for the patient is [facility], [place]   LOS - 3  Subjective:  Patient admitted with altered mental status according to family and her primary care provider she has advanced dementia Patient normally able to communicate somewhat and able to eat.  She is more awake but non verbal. Following some instructions Family at bedside  Review of Systems:   CONSTITUTIONAL: Confused has advanced dementia  Vitals:   Vitals:   07/10/17 1243 07/10/17 1820 07/10/17 1949 07/11/17 0333  BP: (!) 184/47 (!) 164/96 (!) 177/56 (!) 175/58  Pulse: 91 71 75 67  Resp: 16  14 18   Temp: 98.8 F (37.1 C)  98.2 F (36.8 C) 98.1 F (36.7 C)  TempSrc: Oral  Oral Oral  SpO2: 100%  100% 99%  Weight:      Height:        Wt Readings from Last 3 Encounters:  07/08/17 81.9 kg (180 lb 8 oz)  06/24/15 79.4 kg (175 lb)    No intake or output data in the 24 hours ending 07/11/17 4562  Physical Exam:   GENERAL:Elderly chronically ill appearing HEAD, EYES, EARS, NOSE AND THROAT: Atraumatic, normocephalic. Extraocular muscles are intact. Pupils equal and reactive to light. Sclerae anicteric. No conjunctival injection. No oro-pharyngeal erythema.  NECK: Supple. There is no jugular venous distention. No bruits, no lymphadenopathy, no thyromegaly.  HEART: Regular rate and rhythm,. No murmurs, no rubs, no clicks.  LUNGS: Clear to auscultation bilaterally. No rales or rhonchi. No wheezes.  ABDOMEN: Soft, flat, nontender, nondistended. Has good bowel sounds. No hepatosplenomegaly appreciated.   EXTREMITIES: No evidence of any cyanosis, clubbing, or peripheral edema.  +2 pedal and radial pulses bilaterally.  NEUROLOGIC: Patient not following instructions. No falccidity or rigidity. SKIN: Moist and warm with no rashes appreciated.  Psych: Not anxious, depressed LN: No inguinal LN enlargement    Antibiotics   Anti-infectives    Start     Dose/Rate Route Frequency Ordered Stop   07/10/17 1000  levofloxacin (LEVAQUIN) IVPB 750 mg  Status:  Discontinued     750 mg 100 mL/hr over 90 Minutes Intravenous Every 48 hours 07/08/17 0603 07/08/17 1323   07/09/17 1000  levofloxacin (LEVAQUIN) IVPB 750 mg  Status:  Discontinued  750 mg 100 mL/hr over 90 Minutes Intravenous Every 24 hours 07/08/17 0404 07/08/17 0603   07/08/17 1330  meropenem (MERREM) 1 g in sodium chloride 0.9 % 100 mL IVPB     1 g 200 mL/hr over 30 Minutes Intravenous Every 12 hours 07/08/17 1323     07/08/17 0415  levofloxacin (LEVAQUIN) IVPB 750 mg     750 mg 100 mL/hr over 90 Minutes Intravenous  Once 07/08/17 0404 07/08/17 0854      Medications   Scheduled Meds: . aspirin  300 mg Rectal Daily  . brimonidine  1 drop Both Eyes BID  . enoxaparin (LOVENOX) injection  40 mg Subcutaneous Q24H  . fluticasone  1 spray Each Nare Daily  . insulin aspart  0-15 Units Subcutaneous W5I  . trolamine salicylate   Topical BID   Continuous Infusions: . meropenem (MERREM) IV Stopped (07/11/17 0924)  . sodium chloride     PRN Meds:.acetaminophen **OR** acetaminophen (TYLENOL) oral liquid 160 mg/5 mL **OR** acetaminophen, ondansetron (ZOFRAN) IV, senna-docusate   Data Review:   Micro Results Recent Results (from the past 240 hour(s))  Urine culture     Status: Abnormal   Collection Time: 07/07/17  9:05 PM  Result Value Ref Range Status   Specimen Description URINE, RANDOM  Final   Special Requests NONE  Final   Culture (A)  Final    >=100,000 COLONIES/mL ESCHERICHIA COLI Confirmed Extended Spectrum Beta-Lactamase  Producer (ESBL) Performed at [facility], [address] [place], [place] [address]    Report Status 07/10/2017 FINAL  Final   Organism ID, Bacteria ESCHERICHIA COLI (A)  Final      Susceptibility   Escherichia coli - MIC*    AMPICILLIN >=32 RESISTANT Resistant     CEFAZOLIN >=64 RESISTANT Resistant     CEFTRIAXONE >=64 RESISTANT Resistant     CIPROFLOXACIN >=4 RESISTANT Resistant     GENTAMICIN <=1 SENSITIVE Sensitive     IMIPENEM <=0.25 SENSITIVE Sensitive     NITROFURANTOIN <=16 SENSITIVE Sensitive     TRIMETH/SULFA <=20 SENSITIVE Sensitive     AMPICILLIN/SULBACTAM >=32 RESISTANT Resistant     PIP/TAZO 16 SENSITIVE Sensitive     Extended ESBL POSITIVE Resistant     * >=100,000 COLONIES/mL ESCHERICHIA COLI  MRSA PCR Screening     Status: None   Collection Time: 07/08/17  5:17 AM  Result Value Ref Range Status   MRSA by PCR NEGATIVE NEGATIVE Final    Comment:        The GeneXpert MRSA Assay (FDA approved for NASAL specimens only), is one component of a comprehensive MRSA colonization surveillance program. It is not intended to diagnose MRSA infection nor to guide or monitor treatment for MRSA infections.     Radiology Reports Dg Abd Portable 1v  Result Date: 07/08/2017 CLINICAL DATA:  Bowel obstruction. EXAM: PORTABLE ABDOMEN - 1 VIEW COMPARISON:  CT 04/16/2006 . FINDINGS: Surgical clips right upper quadrant. No evidence of bowel distention or free air. Questionable small stone noted projected over the right kidney. Aortic atherosclerosis. Degenerative changes thoracolumbar spine. Lower pelvis not imaged. IMPRESSION: 1.  No evidence of bowel distention or free air. 2.  Questionable small stone noted projected over the right kidney . Electronically Signed   By: Marcello Moores  Register   On: 07/08/2017 06:01   Ct Head Code Stroke Wo Contrast  Result Date: 07/07/2017 CLINICAL DATA:  Code stroke. 83 y/o F; garbled speech. Unable to move legs. EXAM: CT HEAD WITHOUT CONTRAST  TECHNIQUE: Contiguous axial images were obtained from the base of the skull through the vertex without intravenous contrast. COMPARISON:  06/24/2015 CT head FINDINGS: Brain: No evidence of acute infarction, hemorrhage, hydrocephalus, extra-axial collection or mass lesion/mass effect. Stable advanced chronic microvascular ischemic changes and parenchymal volume loss of the brain. Stable chronic lacunar infarcts within left caudate head, left lentiform nucleus, and left hemi pons. Vascular: Severe calcific atherosclerosis of carotid siphons and calcified plaques of the M1 segments. 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 Skull: Normal. Negative for fracture or focal lesion.The Sinuses/Orbits: No acute finding. Other: None. ASPECTS [facility] Stroke Program Early CT Score) - Ganglionic level infarction (caudate, lentiform nuclei, internal capsule, insula, M1-M3 cortex): 7 - Supraganglionic infarction (M4-M6 cortex): 3 Total score (0-10 with 10 being normal): 10 IMPRESSION: 1. No acute intracranial abnormality identified. 2. Stable advanced chronic microvascular ischemic changes and parenchymal volume loss of the brain. 3. ASPECTS is 10 These results were called by telephone at the time of interpretation on 07/07/2017 at 8:11 pm to Dr. Quentin Cornwall, who verbally acknowledged these results. Electronically Signed   By: Kristine Garbe M.D.   On: 07/07/2017 20:12     CBC  Recent Labs Lab 07/07/17 1932 07/08/17 0312  WBC 11.1* 12.7*  HGB 12.4 11.6*  HCT 37.5 35.2  PLT 355 332  MCV 89.4 86.9  MCH 29.5 28.8  MCHC 33.0 33.1  RDW 12.9 12.7  LYMPHSABS 2.5  --   MONOABS 1.5*  --   EOSABS 0.3  --   BASOSABS 0.1  --     Chemistries   Recent Labs Lab 07/07/17 1932 07/08/17 0312  NA 141  --   K 3.6  --   CL 100*  --   CO2 31  --   GLUCOSE 179*  --   BUN 18  --   CREATININE 0.77 0.84  CALCIUM 9.2  --   AST 20  --   ALT 13*  --   ALKPHOS 78  --   BILITOT 0.5  --     ------------------------------------------------------------------------------------------------------------------ estimated creatinine clearance is 48.7 mL/min (by C-G formula based on SCr of 0.84 mg/dL). ------------------------------------------------------------------------------------------------------------------ No results for input(s): HGBA1C in the last 72 hours. ------------------------------------------------------------------------------------------------------------------ No results for input(s): CHOL, HDL, LDLCALC, TRIG, CHOLHDL, LDLDIRECT in the last 72 hours. ------------------------------------------------------------------------------------------------------------------ No results for input(s): TSH, T4TOTAL, T3FREE, THYROIDAB in the last 72 hours.  Invalid input(s): FREET3 ------------------------------------------------------------------------------------------------------------------ No results for input(s): VITAMINB12, FOLATE, FERRITIN, TIBC, IRON, RETICCTPCT in the last 72 hours.  Coagulation profile No results for input(s): INR, PROTIME in the last 168 hours.  No results for input(s): DDIMER in the last 72 hours.  Cardiac Enzymes  Recent Labs Lab 07/07/17 1932 07/08/17 0312  TROPONINI <0.03 <0.03   ------------------------------------------------------------------------------------------------------------------ Invalid input(s): POCBNP    Assessment & Plan   This is a 83 y.o. female with a history of CKD, dementia, diabetes, hypertension, hyperlipidemia, hypothyroidism, obstructive sleep apnea now being admitted with:  #. AMS, likely multifactorial;Including ESBL UTI and possible CVA patient's daughter  would not like an echo carotid Dopplers MRI etc.  stroke workup cancelled ON IV abx to see if patient improves  #. ESBL UTI ON meropenem. Wait for cx and sensitivity  # Dysphagia Family doesn't want PEG Will need hospice home if unable to  swallow  Family still trying to decide with rehabilitation or hospice home.  #. Immobility s/p fall  #. History of CK D, chronic and stable -Monitor BMP  #. History of hypertension - Blood  pressure medications on hold  #. History of hyperlipidemia - Hold by mouth Zocor for now  #. History of dementia/depression - Hold by mouth Paxil for now  #. History of hypothyroidism - Hold by mouth Synthroid for now  #. H/o Diabetes - Accuchecks q4h with RISS coverage   Met with patient's family including daughter and son at bedside. We discussed regarding dysphagia and risk of aspiration. Altered mental status with ESBL UTI and possible CVA. Daughter and son question stopping IV fluids. Advised that IV fluids are only temporary and would not be able to be given once patient is discharged. They are concerned that we are stopping treatment prior to patient having a plan for discharge. Explained that we would monitor patient with the next 24 hours off IV fluids. Patient will continue IV antibiotics. At this time she is nothing by mouth. They're requesting she be up in a chair. Patient following some instructions at this time. We will have swallow evaluation again tomorrow. Discussed at length with family and answered all conditions. Time spent on advance care planning was 20 minutes.       Code Status Orders        Start     Ordered   07/08/17 0323  Do not attempt resuscitation (DNR)  Continuous    Question Answer Comment  In the event of cardiac or respiratory ARREST Do not call a "code blue"   In the event of cardiac or respiratory ARREST Do not perform Intubation, CPR, defibrillation or ACLS   In the event of cardiac or respiratory ARREST Use medication by any route, position, wound care, and other measures to relive pain and suffering. May use oxygen, suction and manual treatment of airway obstruction as needed for comfort.      07/08/17 0323    Code Status History    Date  Active Date Inactive Code Status Order ID Comments User Context   07/08/2017  2:34 AM 07/08/2017  3:23 AM Full Code [id number]  Hugelmeyer, Ubaldo Glassing, DO Inpatient    Advance Directive Documentation     Most Recent Value  Type of Advance Directive  Out of facility DNR (pink MOST or yellow form)  Pre-existing out of facility DNR order (yellow form or pink MOST form)  -  "MOST" Form in Place?  -      Consults none  DVT Prophylaxis  Lovenox  Lab Results  Component Value Date   PLT 332 07/08/2017     Time Spent in minutes  30 min   Hillary Bow R M.D on 07/11/2017 at 9:29 AM  Between 7am to 6pm - Pager - [phone]  After 6pm go to [email or web address] - password [email or web address] [facility]   Office  [phone]

## 2017-07-11 NOTE — Clinical Social Work Note (Signed)
CSW met with pt's son to address consult for residential hospice and offered choice. Pt's son chose [facility].  [facility] Hospice Home. CSW made referral to [facility]. Per Hospice Liaison pt will be able to transfer to tomorrow at 12:30. MD is aware as well as [facility]. CSW will continue to follow.   Darden Dates, MSW, LCSW  Clinical Social Worker  [phone]

## 2017-07-11 NOTE — Progress Notes (Signed)
Examined patient.  Spoke with Son at bedside and Dtr on the phone.  Well developed elderly demented female.  Advanced dementia.  Not eating or drinking No respiratory distress.  Patient is comfortable and not agitated.  Per speech unable to eat or drink.  Patient appears to have no desire to eat or drink. She is not speaking but will grunt and occasionally smile.  The family expressed concern that she is not sleeping despite being given something for sleep.  Despite not sleeping the patient appears comfortable.  Family is always at bedside.  Son and daughter agree that their mother is at End of Life.  They request that she be discharged to [facility] when a bed is available.   Comfort orders were discussed.  Case discussed with case management and social work.   Dr. Elpidio Anis has competed the discharge summary.  Anticipate discharge to [facility] as soon as bed is available.  Norvel Richards, PA-C Palliative Medicine Pager: [phone]    Total time 15 min.

## 2017-07-11 NOTE — Care Management Important Message (Signed)
Important Message  Patient Details  Name: Cassandra Fuentes MRN: [id number] Date of Birth: [birth year]   Medicare Important Message Given:  Yes    Gwenette Greet, RN 07/11/2017, 8:45 AM

## 2017-07-11 NOTE — Progress Notes (Signed)
Physical Therapy Treatment Patient Details Name: Cassandra Fuentes MRN: [id number] DOB: 11-14-1929 Today's Date: 07/11/2017    History of Present Illness Pt is a 83 yo F, admitted to acute care with alterned mental status on 08/20. Prior to admission, pt required assistance for all ADL's, and amb with rollator. PMH: dementia, CKD, diabetes mellitus, glaucoma, HLD, HTN, hypothyroidism, macular degineration, obstructive sleep apnea, and urinary incontinence. Patient had a fall 10 days ago and has not been mobile since that time.     PT Comments    Pt pleasant and smiling upon arrival; able to intermittently follow commands, better than prior session. Pt performs bed mobility with ModA, and is able to assist in using rails and bringin LE's off of bed. Pt with improved sitting balance and able to follow commands and reach forward for therapists hands. Tranfers and ambulation deferred due to safety concerns. Pt cont to present with the following deficits: strength, endurance, balance, and safety awareness. Overall, pt responded well to today's treatment with no adverse affects, and is progressing towards functional goals, as seen by requiring less assist for coming to sit and sitting at EOB. Pt would benefit from skilled PT to address the previously mentioned impairments and promote return to PLOF. Currently recommending SNF, pending d/c.     Follow Up Recommendations  SNF     Equipment Recommendations  None recommended by PT    Recommendations for Other Services       Precautions / Restrictions Precautions Precautions: Fall Restrictions Weight Bearing Restrictions: No    Mobility  Bed Mobility Overal bed mobility: Needs Assistance Bed Mobility: Supine to Sit;Sit to Supine     Supine to sit: Mod assist     General bed mobility comments: Pt ModA +1 for supine to sit adn sit to supine today. She was able to assist in bringing B LE's off bed and using hand rail adn HHA to come to sit.  Requires maxA to scoot to EOB. Initially required MaxA to sit at EOB, but was able to progress to CGA-MinA, intermittently losing balance posteriorly. When returning to supine, pt daughter assisted in scooting pt up in bed.   Transfers                 General transfer comment: Deferred due to safety concerns  Ambulation/Gait             General Gait Details: Deferred due to safety concerns   Stairs            Wheelchair Mobility    Modified Rankin (Stroke Patients Only)       Balance                                            Cognition Arousal/Alertness: Awake/alert Behavior During Therapy: WFL for tasks assessed/performed Overall Cognitive Status: Impaired/Different from baseline Area of Impairment: Safety/judgement;Following commands;Memory;Attention;Problem solving                       Following Commands: Follows one step commands inconsistently Safety/Judgement: Decreased awareness of safety;Decreased awareness of deficits     General Comments: Pt abl eto follow one-step commands more consistently today than prior session. Cont to requires verbal/visual, and tactile cues.       Exercises Other Exercises Other Exercises: Sitting at EOB ~5 min, initially with MaxA, progressing to intermittent CGA-MInA.  Pt able to follow commands to reach forward with B UE's ~5 reps with each UE.     General Comments        Pertinent Vitals/Pain Pain Assessment: No/denies pain Faces Pain Scale: No hurt    Home Living                      Prior Function            PT Goals (current goals can now be found in the care plan section) Acute Rehab PT Goals Patient Stated Goal: unable to state, son reports he would like patient to do as much as she is able PT Goal Formulation: With patient/family Time For Goal Achievement: 07/23/17 Potential to Achieve Goals: Fair Progress towards PT goals: Progressing toward goals     Frequency    Min 2X/week      PT Plan Current plan remains appropriate    Co-evaluation              AM-PAC PT "6 Clicks" Daily Activity  Outcome Measure  Difficulty turning over in bed (including adjusting bedclothes, sheets and blankets)?: Unable Difficulty moving from lying on back to sitting on the side of the bed? : Unable Difficulty sitting down on and standing up from a chair with arms (e.g., wheelchair, bedside commode, etc,.)?: Unable Help needed moving to and from a bed to chair (including a wheelchair)?: Total Help needed walking in hospital room?: Total Help needed climbing 3-5 steps with a railing? : Total 6 Click Score: 6    End of Session   Activity Tolerance: Patient tolerated treatment well Patient left: in bed;with call bell/phone within reach;with bed alarm set;with family/visitor present Nurse Communication: Mobility status PT Visit Diagnosis: Unsteadiness on feet (R26.81);Other abnormalities of gait and mobility (R26.89);Muscle weakness (generalized) (M62.81);History of falling (Z91.81)     Time: 0943-1000 PT Time Calculation (min) (ACUTE ONLY): 17 min  Charges:                       G Codes:       Sharman Cheek PT, SPT   Latanya Maudlin 07/11/2017, 1:50 PM

## 2017-07-11 NOTE — Progress Notes (Signed)
  Speech Language Pathology Treatment: Dysphagia  Patient Details Name: Cassandra Fuentes MRN: [id number] DOB: February 27, 1929 Today's Date: 07/11/2017 Time: 3612-2449 SLP Time Calculation (min) (ACUTE ONLY): 60 min  Assessment / Plan / Recommendation Clinical Impression  Pt seen for ongoing assessment for safe toleration of po intake. Pt awake/alert and exhibiting some oral awareness this morning, however, remains mostly nonverbal and does not follow commands. She smiled often at SLP. Family stated pt has not slept since yesterday afternoon. She appeared to fatigue quickly w/ the exertion of po trials - an increased risk for aspiration for pt.  Pt was given 1/2-3/4 TSP trials of puree and Honey consistency liquids (~4 trials total) w/ pt demonstrating oral holding, decreased lingual/labial movements for bolus management, slow and deliberate A-P transfer, min lingual protrusion during the swallowing, and bolus residue moreso on Right side of mouth/buccal area. Bolus (oral) residue had to be removed by swabs by SLP. Pt given max verbal/tactile/model cues were given to encourage oropharyngeal phases of swallowing by SLP. More oral holding noted today; some oral attempts at A-P transfer but less swallowing. As pt appeared to fatigue, no further trials were given.  As pt continues to present w/ increased risk for aspiration, no formal oral diet is recommended at this time by SLP. To continue to present pt w/ swallowing exposure, oral care recommended frequently w/ strict aspiration precautions. Education given to Son/Dtr on dysphagia; risk for aspiration; impact on overall nutritional intake; strategies to enhance swallowing; recommendation for further discussion w/ palliative care services re: poc. ST services will continue to f/u w/ pt's status and ongoing assessment to establish po diet if able. Son/Dtr agreed; NSG/MD educated/updated.      HPI HPI: 83 year old female admitted 8/201/8 with some drooling ,  mumbled/slurred speech, and AMS. PMH: dementia, CKD, DM, HTN, HLD, hypothyroid, OSA. Patient at baseline was ambulatory with a walker and had spontaneous speech although not always related to what was being discussed.  Incontinent.  Had a fall about 10 days ago and since that time has not been ambulatory; unsure of her other presenation of ADLs such as eating/drinking. Currently today, pt is more awake and requires less stimulation; minimal attempts to phonate/communicate(muttered/mumbled movements) still. Pt does not follow any commands and appeared defensive w/ oral stimluation from oral care. She appeared to exhibit min more oral awareness w/ po tasks today as did yesterday(smiled and appeared to enjoy some engagement in conversation w/ SLP).       SLP Plan  Continue with current plan of care (discussion w/ MD and palliative care)       Recommendations  Diet recommendations: NPO (oral care)                General recommendations:  (palliative care) Oral Care Recommendations: Oral care QID;Staff/trained caregiver to provide oral care Follow up Recommendations:  (discussion re: hospice home services) SLP Visit Diagnosis: Dysphagia, oropharyngeal phase (R13.12) Plan: Continue with current plan of care (discussion w/ MD and palliative care)       GO               Jerilynn Som, MS, CCC-SLP Memori Sammon 07/11/2017, 2:56 PM

## 2017-07-11 NOTE — Discharge Summary (Addendum)
[facility] - [place] at [facility]   PATIENT NAME: Cassandra Fuentes    MR#:  [id number]  DATE OF BIRTH:  Jul 09, 1929  DATE OF ADMISSION:  07/07/2017 ADMITTING PHYSICIAN: Tonye Royalty, DO  DATE OF DISCHARGE: 07/12/2017  PRIMARY CARE PHYSICIAN: [facility]   ADMISSION DIAGNOSIS:  Acute cystitis without hematuria [N30.00] Altered mental status, unspecified altered mental status type [R41.82]  DISCHARGE DIAGNOSIS:  Active Problems:   Altered mental status   Acute cystitis without hematuria   Palliative care encounter   Encounter for hospice care discussion   Alzheimer disease   SECONDARY DIAGNOSIS:   Past Medical History:  Diagnosis Date  . Chronic kidney disease   . Dementia   . Diabetes mellitus without complication (HCC)   . Glaucoma   . Hyperlipidemia   . Hypertension   . Hypothyroidism   . Hypothyroidism   . Macular degeneration   . Obstructive sleep apnea   . Urinary incontinence      ADMITTING HISTORY  Chief Complaint:     Chief Complaint  Patient presents with  . Drooling  Please note the entire history is obtained from the patient's emergency department chart, emergency department provider and the patient's family who is at the bedside. Patient's personal history is limited by dementia and altered mental status.   HPI: Cassandra Fuentes is a 83 y.o. female with a known history of CK D, dementia, diabetes, hypertension, hyperlipidemia, hypothyroidism, obstructive sleep apnea presents to the emergency department for evaluation of altered mental status.  Patient was in a usual state of health until this afternoon when she was noted to have altered mental status with some drooling and slurred speech at her living facility..  Of note patient had a fall 10 days ago and has been unable to ambulate since then even with her walker.   HOSPITAL COURSE:   This is a 83 y.o.femalewith a history of CKD, dementia, diabetes,  hypertension, hyperlipidemia, hypothyroidism, obstructive sleep apneanow being admitted with:  #. AMS, likely multifactorial;Including ESBL UTI and possible CVA patient's daughter  would not like an echo carotid Dopplers MRI etc.  stroke workup cancelled #. ESBL UTI # Dysphagia Family doesn't want PEG #. Immobility s/p fall #. History of CKD3 #. History of hypertension #. History of hyperlipidemia #. History of dementia/depression #. History of hypothyroidism #. H/o Diabetes  Patient was admitted to medical floor for altered mental status. Started on IV meropenem for UTI. Cultures returned being ESBL. There is no significant improvement in patient's status. Mild improvement initially on her mental status. Patient is alert and awake. Smiling. But unable to swallow. She does have prior which wishes of being DO NOT RESUSCITATE and not wanting a PEG tube. Family has wanted to respect her wishes and have decided regarding comfort measures at this time. Morphine and Ativan as needed added. Patient will be discharged to hospice home for end of life care.  CONSULTS OBTAINED:  Treatment Team:  Thana Farr, MD  DRUG ALLERGIES:   Allergies  Allergen Reactions  . Penicillins Hives  . Telepaque [Iopanoic Acid] Other (See Comments)    Unknown reaction  . Morphine And Related Nausea And Vomiting    DISCHARGE MEDICATIONS:   Current Discharge Medication List    START taking these medications   Details  LORazepam (ATIVAN) 2 MG/ML concentrated solution Take 0.5 mLs (1 mg total) by mouth every 6 (six) hours as needed for anxiety or sleep. Qty: 30 mL, Refills: 0  Morphine Sulfate (MORPHINE CONCENTRATE) 10 mg / 0.5 ml concentrated solution Place 0.5 mLs (10 mg total) under the tongue every 3 (three) hours as needed for severe pain or shortness of breath. Qty: 30 mL, Refills: 0      STOP taking these medications     acetaminophen (TYLENOL) 325 MG tablet      acetaminophen  (TYLENOL) 500 MG tablet      acetaminophen (TYLENOL) 650 MG CR tablet      Alum & Mag Hydroxide-Simeth (GERI-LANTA PO)      brimonidine (ALPHAGAN) 0.15 % ophthalmic solution      Calcium Carbonate-Vitamin D (CALCIUM 600+D) 600-400 MG-UNIT per tablet      clopidogrel (PLAVIX) 75 MG tablet      enalapril (VASOTEC) 20 MG tablet      guaiFENesin (Q-TUSSIN) 100 MG/5ML liquid      levothyroxine (SYNTHROID, LEVOTHROID) 150 MCG tablet      loperamide (IMODIUM) 2 MG capsule      magnesium hydroxide (MILK OF MAGNESIA) 400 MG/5ML suspension      Melatonin 5 MG TABS      mometasone (NASONEX) 50 MCG/ACT nasal spray      multivitamin-lutein (OCUVITE-LUTEIN) CAPS capsule      PARoxetine (PAXIL) 10 MG tablet      simvastatin (ZOCOR) 40 MG tablet      Trolamine Salicylate (ASPERCREME) 10 % LOTN         Today   VITAL SIGNS:  Blood pressure (!) 176/44, pulse 95, temperature 98.2 F (36.8 C), temperature source Oral, resp. rate 16, height 5\' 3"  (1.6 m), weight 81.9 kg (180 lb 8 oz), SpO2 93 %.  I/O:  No intake or output data in the 24 hours ending 07/12/17 1029  PHYSICAL EXAMINATION:  Physical Exam  GENERAL:  83 y.o.-year-old patient lying in the bed with no acute distress.  LUNGS: Normal breath sounds bilaterally, no wheezing, rales,rhonchi or crepitation. No use of accessory muscles of respiration.  CARDIOVASCULAR: S1, S2 normal. No murmurs, rubs, or gallops.  NEUROLOGIC: Moves all 4 extremities. PSYCHIATRIC: The patient is alert and awake.Pleasantly confused Nonverbal  DATA REVIEW:   CBC  Recent Labs Lab 07/08/17 0312  WBC 12.7*  HGB 11.6*  HCT 35.2  PLT 332    Chemistries   Recent Labs Lab 07/07/17 1932 07/08/17 0312  NA 141  --   K 3.6  --   CL 100*  --   CO2 31  --   GLUCOSE 179*  --   BUN 18  --   CREATININE 0.77 0.84  CALCIUM 9.2  --   AST 20  --   ALT 13*  --   ALKPHOS 78  --   BILITOT 0.5  --     Cardiac Enzymes  Recent Labs Lab  07/08/17 0312  TROPONINI <0.03    Microbiology Results  Results for orders placed or performed during the hospital encounter of 07/07/17  Urine culture     Status: Abnormal   Collection Time: 07/07/17  9:05 PM  Result Value Ref Range Status   Specimen Description URINE, RANDOM  Final   Special Requests NONE  Final   Culture (A)  Final    >=100,000 COLONIES/mL ESCHERICHIA COLI Confirmed Extended Spectrum Beta-Lactamase Producer (ESBL) Performed at [facility], [address] [place], [place] [address]    Report Status 07/10/2017 FINAL  Final   Organism ID, Bacteria ESCHERICHIA COLI (A)  Final      Susceptibility   Escherichia  coli - MIC*    AMPICILLIN >=32 RESISTANT Resistant     CEFAZOLIN >=64 RESISTANT Resistant     CEFTRIAXONE >=64 RESISTANT Resistant     CIPROFLOXACIN >=4 RESISTANT Resistant     GENTAMICIN <=1 SENSITIVE Sensitive     IMIPENEM <=0.25 SENSITIVE Sensitive     NITROFURANTOIN <=16 SENSITIVE Sensitive     TRIMETH/SULFA <=20 SENSITIVE Sensitive     AMPICILLIN/SULBACTAM >=32 RESISTANT Resistant     PIP/TAZO 16 SENSITIVE Sensitive     Extended ESBL POSITIVE Resistant     * >=100,000 COLONIES/mL ESCHERICHIA COLI  MRSA PCR Screening     Status: None   Collection Time: 07/08/17  5:17 AM  Result Value Ref Range Status   MRSA by PCR NEGATIVE NEGATIVE Final    Comment:        The GeneXpert MRSA Assay (FDA approved for NASAL specimens only), is one component of a comprehensive MRSA colonization surveillance program. It is not intended to diagnose MRSA infection nor to guide or monitor treatment for MRSA infections.     RADIOLOGY:  No results found.  Follow up with PCP in 1 week.  Management plans discussed with the patient, family and they are in agreement.  CODE STATUS:     Code Status Orders        Start     Ordered   07/08/17 0323  Do not attempt resuscitation (DNR)  Continuous    Question Answer Comment  In the event of cardiac or  respiratory ARREST Do not call a "code blue"   In the event of cardiac or respiratory ARREST Do not perform Intubation, CPR, defibrillation or ACLS   In the event of cardiac or respiratory ARREST Use medication by any route, position, wound care, and other measures to relive pain and suffering. May use oxygen, suction and manual treatment of airway obstruction as needed for comfort.      07/08/17 0323    Code Status History    Date Active Date Inactive Code Status Order ID Comments User Context   07/08/2017  2:34 AM 07/08/2017  3:23 AM Full Code [id number]  Hugelmeyer, Jon Gills, DO Inpatient    Advance Directive Documentation     Most Recent Value  Type of Advance Directive  Out of facility DNR (pink MOST or yellow form)  Pre-existing out of facility DNR order (yellow form or pink MOST form)  -  "MOST" Form in Place?  -      TOTAL TIME TAKING CARE OF THIS PATIENT ON DAY OF DISCHARGE: more than 30 minutes.   Milagros Loll R M.D on 07/12/2017 at 10:29 AM  Between 7am to 6pm - Pager - [phone]  After 6pm go to [email or web address] - password [email or web address] [facility]  [facility] [place]  Office  [phone]  CC: Primary care physician; [facility] [employer]  Note: This dictation was prepared with Nurse, children's dictation along with smaller Lobbyist. Any transcriptional errors that result from this process are unintentional.

## 2017-07-11 NOTE — Progress Notes (Addendum)
[employer] [facility] - [place] at [facility]                                                                                                                                                                                  Patient Demographics   Cassandra Fuentes, is a 83 y.o. female, DOB - 1929-10-12, FYB:[id number]  Admit date - 07/07/2017   Admitting Physician Tonye Royalty, DO  Outpatient Primary MD for the patient is [facility]   LOS - 3  Subjective:  Patient admitted with altered mental status according to family and her primary care provider she has advanced dementia Patient normally able to communicate somewhat and able to eat.  Patient is awake but failed swallow evaluation.  Review of Systems:   CONSTITUTIONAL: Confused has advanced dementia  Vitals:   Vitals:   07/10/17 1243 07/10/17 1820 07/10/17 1949 07/11/17 0333  BP: (!) 184/47 (!) 164/96 (!) 177/56 (!) 175/58  Pulse: 91 71 75 67  Resp: 16  14 18   Temp: 98.8 F (37.1 C)  98.2 F (36.8 C) 98.1 F (36.7 C)  TempSrc: Oral  Oral Oral  SpO2: 100%  100% 99%  Weight:      Height:        Wt Readings from Last 3 Encounters:  07/08/17 81.9 kg (180 lb 8 oz)  06/24/15 79.4 kg (175 lb)    No intake or output data in the 24 hours ending 07/11/17 1207  Physical Exam:   GENERAL:Elderly chronically ill appearing HEAD, EYES, EARS, NOSE AND THROAT: Atraumatic, normocephalic.  Sclerae anicteric. No conjunctival injection.  NECK: Supple. There is no jugular venous distention. No bruits, no lymphadenopathy, no thyromegaly.  HEART: Regular rate and rhythm,. No murmurs, no rubs, no clicks.  LUNGS: Clear to auscultation bilaterally. No rales or rhonchi. No wheezes.  NEUROLOGIC: Patient not following instructions.  Psychiatric- Pleasantly confused LN: No inguinal LN enlargement    Antibiotics   Anti-infectives    Start     Dose/Rate Route Frequency Ordered Stop   07/10/17 1000  levofloxacin  (LEVAQUIN) IVPB 750 mg  Status:  Discontinued     750 mg 100 mL/hr over 90 Minutes Intravenous Every 48 hours 07/08/17 0603 07/08/17 1323   07/09/17 1000  levofloxacin (LEVAQUIN) IVPB 750 mg  Status:  Discontinued     750 mg 100 mL/hr over 90 Minutes Intravenous Every 24 hours 07/08/17 0404 07/08/17 0603   07/08/17 1330  meropenem (MERREM) 1 g in sodium chloride 0.9 % 100 mL IVPB  Status:  Discontinued     1 g 200 mL/hr over 30 Minutes Intravenous Every 12 hours 07/08/17 1323 07/11/17 1145   07/08/17  1610  levofloxacin (LEVAQUIN) IVPB 750 mg     750 mg 100 mL/hr over 90 Minutes Intravenous  Once 07/08/17 0404 07/08/17 0854      Medications   Scheduled Meds: . brimonidine  1 drop Both Eyes BID  . trolamine salicylate   Topical BID   Continuous Infusions: . sodium chloride     PRN Meds:.acetaminophen **OR** acetaminophen (TYLENOL) oral liquid 160 mg/5 mL **OR** acetaminophen, LORazepam, LORazepam, morphine CONCENTRATE, ondansetron (ZOFRAN) IV   Data Review:   Micro Results Recent Results (from the past 240 hour(s))  Urine culture     Status: Abnormal   Collection Time: 07/07/17  9:05 PM  Result Value Ref Range Status   Specimen Description URINE, RANDOM  Final   Special Requests NONE  Final   Culture (A)  Final    >=100,000 COLONIES/mL ESCHERICHIA COLI Confirmed Extended Spectrum Beta-Lactamase Producer (ESBL) Performed at [facility], [address], [place], [place] [address]    Report Status 07/10/2017 FINAL  Final   Organism ID, Bacteria ESCHERICHIA COLI (A)  Final      Susceptibility   Escherichia coli - MIC*    AMPICILLIN >=32 RESISTANT Resistant     CEFAZOLIN >=64 RESISTANT Resistant     CEFTRIAXONE >=64 RESISTANT Resistant     CIPROFLOXACIN >=4 RESISTANT Resistant     GENTAMICIN <=1 SENSITIVE Sensitive     IMIPENEM <=0.25 SENSITIVE Sensitive     NITROFURANTOIN <=16 SENSITIVE Sensitive     TRIMETH/SULFA <=20 SENSITIVE Sensitive     AMPICILLIN/SULBACTAM  >=32 RESISTANT Resistant     PIP/TAZO 16 SENSITIVE Sensitive     Extended ESBL POSITIVE Resistant     * >=100,000 COLONIES/mL ESCHERICHIA COLI  MRSA PCR Screening     Status: None   Collection Time: 07/08/17  5:17 AM  Result Value Ref Range Status   MRSA by PCR NEGATIVE NEGATIVE Final    Comment:        The GeneXpert MRSA Assay (FDA approved for NASAL specimens only), is one component of a comprehensive MRSA colonization surveillance program. It is not intended to diagnose MRSA infection nor to guide or monitor treatment for MRSA infections.     Radiology Reports Dg Abd Portable 1v  Result Date: 07/08/2017 CLINICAL DATA:  Bowel obstruction. EXAM: PORTABLE ABDOMEN - 1 VIEW COMPARISON:  CT 04/16/2006 . FINDINGS: Surgical clips right upper quadrant. No evidence of bowel distention or free air. Questionable small stone noted projected over the right kidney. Aortic atherosclerosis. Degenerative changes thoracolumbar spine. Lower pelvis not imaged. IMPRESSION: 1.  No evidence of bowel distention or free air. 2.  Questionable small stone noted projected over the right kidney . Electronically Signed   By: Maisie Fus  Register   On: 07/08/2017 06:01   Ct Head Code Stroke Wo Contrast  Result Date: 07/07/2017 CLINICAL DATA:  Code stroke. 83 y/o F; garbled speech. Unable to move legs. EXAM: CT HEAD WITHOUT CONTRAST TECHNIQUE: Contiguous axial images were obtained from the base of the skull through the vertex without intravenous contrast. COMPARISON:  06/24/2015 CT head FINDINGS: Brain: No evidence of acute infarction, hemorrhage, hydrocephalus, extra-axial collection or mass lesion/mass effect. Stable advanced chronic microvascular ischemic changes and parenchymal volume loss of the brain. Stable chronic lacunar infarcts within left caudate head, left lentiform nucleus, and left hemi pons. Vascular: Severe calcific atherosclerosis of carotid siphons and calcified plaques of the M1 segments. 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 Skull: Normal. Negative for fracture or focal lesion.The Sinuses/Orbits: No  acute finding. Other: None. ASPECTS [facility] Stroke Program Early CT Score) - Ganglionic level infarction (caudate, lentiform nuclei, internal capsule, insula, M1-M3 cortex): 7 - Supraganglionic infarction (M4-M6 cortex): 3 Total score (0-10 with 10 being normal): 10 IMPRESSION: 1. No acute intracranial abnormality identified. 2. Stable advanced chronic microvascular ischemic changes and parenchymal volume loss of the brain. 3. ASPECTS is 10 These results were called by telephone at the time of interpretation on 07/07/2017 at 8:11 pm to Dr. Roxan Hockey, who verbally acknowledged these results. Electronically Signed   By: Mitzi Hansen M.D.   On: 07/07/2017 20:12     CBC  Recent Labs Lab 07/07/17 1932 07/08/17 0312  WBC 11.1* 12.7*  HGB 12.4 11.6*  HCT 37.5 35.2  PLT 355 332  MCV 89.4 86.9  MCH 29.5 28.8  MCHC 33.0 33.1  RDW 12.9 12.7  LYMPHSABS 2.5  --   MONOABS 1.5*  --   EOSABS 0.3  --   BASOSABS 0.1  --     Chemistries   Recent Labs Lab 07/07/17 1932 07/08/17 0312  NA 141  --   K 3.6  --   CL 100*  --   CO2 31  --   GLUCOSE 179*  --   BUN 18  --   CREATININE 0.77 0.84  CALCIUM 9.2  --   AST 20  --   ALT 13*  --   ALKPHOS 78  --   BILITOT 0.5  --    ------------------------------------------------------------------------------------------------------------------ estimated creatinine clearance is 48.7 mL/min (by C-G formula based on SCr of 0.84 mg/dL). ------------------------------------------------------------------------------------------------------------------ No results for input(s): HGBA1C in the last 72 hours. ------------------------------------------------------------------------------------------------------------------ No results for input(s): CHOL, HDL, LDLCALC, TRIG, CHOLHDL, LDLDIRECT in the last 72  hours. ------------------------------------------------------------------------------------------------------------------ No results for input(s): TSH, T4TOTAL, T3FREE, THYROIDAB in the last 72 hours.  Invalid input(s): FREET3 ------------------------------------------------------------------------------------------------------------------ No results for input(s): VITAMINB12, FOLATE, FERRITIN, TIBC, IRON, RETICCTPCT in the last 72 hours.  Coagulation profile No results for input(s): INR, PROTIME in the last 168 hours.  No results for input(s): DDIMER in the last 72 hours.  Cardiac Enzymes  Recent Labs Lab 07/07/17 1932 07/08/17 0312  TROPONINI <0.03 <0.03   ------------------------------------------------------------------------------------------------------------------ Invalid input(s): POCBNP    Assessment & Plan   This is a 83 y.o. female with a history of CKD, dementia, diabetes, hypertension, hyperlipidemia, hypothyroidism, obstructive sleep apnea now being admitted with:  #. AMS, likely multifactorial;Including ESBL UTI and possible CVA patient's daughter  would not like an echo carotid Dopplers MRI etc.  stroke workup cancelled #. ESBL UTI # Dysphagia Family doesn't want PEG #. Immobility s/p fall #. History of CKD3 #. History of hypertension #. History of hyperlipidemia #. History of dementia/depression #. History of hypothyroidism #. H/o Diabetes   Patient unable to swallow food. In spite of being on IV meropenem she has shown no significant improvement. Family does not want a PEG tube as per patient's prior wishes. At this time patient is hospice home appropriate. Will need to get in touch with pace program regarding hospice home with patient. We'll start comfort measures here. Stopped IV antibiotics and Accu-Cheks. Added morphine and Ativan when necessary.       Code Status Orders        Start     Ordered   07/08/17 0323  Do not attempt  resuscitation (DNR)  Continuous    Question Answer Comment  In the event of cardiac or respiratory ARREST Do not call a "code blue"  In the event of cardiac or respiratory ARREST Do not perform Intubation, CPR, defibrillation or ACLS   In the event of cardiac or respiratory ARREST Use medication by any route, position, wound care, and other measures to relive pain and suffering. May use oxygen, suction and manual treatment of airway obstruction as needed for comfort.      07/08/17 0323    Code Status History    Date Active Date Inactive Code Status Order ID Comments User Context   07/08/2017  2:34 AM 07/08/2017  3:23 AM Full Code [id number]  Hugelmeyer, Jon Gills, DO Inpatient    Advance Directive Documentation     Most Recent Value  Type of Advance Directive  Out of facility DNR (pink MOST or yellow form)  Pre-existing out of facility DNR order (yellow form or pink MOST form)  -  "MOST" Form in Place?  -      Consults none  Lab Results  Component Value Date   PLT 332 07/08/2017    Time Spent in minutes  40 min >50% time spent in coordinating care and discussing with family at bedside.  Milagros Loll R M.D on 07/11/2017 at 12:07 PM  Between 7am to 6pm - Pager - [phone]  After 6pm go to [email or web address] - password [email or web address] [facility]  [facility]   Office  [phone]

## 2017-07-11 NOTE — Progress Notes (Signed)
New referral for hospice home for EOL care on a 83yo female PACE patient  who is post palliative medicine consult. Cassandra Fuentes  was admitted to [facility] on 8.20.18 with AMS with drooling and slurred speech.  She has history of CKD,dementia, DM, HTN, hyperlipidemia, hypothyrodism and obstructive sleep apnea.   Post palliative medicine consult, the patient's son and daughter will honor her wishes to be DNR and no PEG and want to transition patient to full comfort measures.  The patient is awake, alert, interactive and smiles- but is unable to speak or swallow.   Spoke with patient's daughter who will get her brother/HCPOA to come to the hospice home tomorrow at 1030 to meet with Hal Hope SW to complete admission paperwork and PACE forms.  Spoke with Kingsley Spittle SW/[facility] who will have staff set transport for 1230 tomorrow (8.25.18).  RN to call report to the hospice home admit line.  [phone].  Referral information obtained and faxed to referral intake.  Thank you for allowing participation in this patient's care.  Norris Cross RN

## 2017-07-12 NOTE — Progress Notes (Signed)
Pt being discharged to hospice home, report given to Zella Ball, family at bedside, pt resting comfortably, no complaints, EMS to transport

## 2017-07-12 NOTE — Clinical Social Work Note (Signed)
Patient to discharge to [facility] today via EMS. Family is currently at [facility] completing paperwork and are in agreement. CSW has delivered the discharge packet to the chart including the DNR form. CSW will continue to follow pending additional discharge needs.  Argentina Ponder, MSW, Theresia Majors [phone]

## 2017-08-18 DEATH — deceased

## 2018-07-14 IMAGING — CT CT HEAD CODE STROKE
3 of 4 series · 14 of 47 positions shown, 16 images · non-contrast
Comparison: 06/24/2015 CT head

CLINICAL DATA: Code stroke. 88 y/o F; garbled speech. Unable to
move legs.

EXAM:
CT HEAD WITHOUT CONTRAST
TECHNIQUE: Contiguous axial images were obtained from the base of the skull
through the vertex without intravenous contrast.

[Series 4: coronal soft tissue · coronal · 0.31mm/px · 3 of 63 slices shown]
[im 21/63  brain]
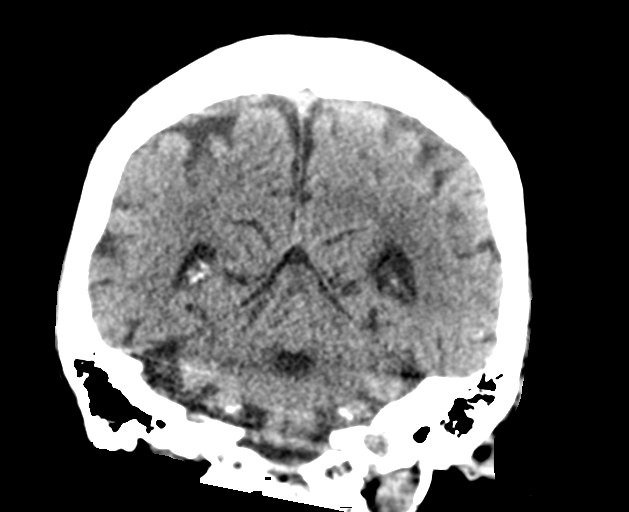
[im 28/63  brain]
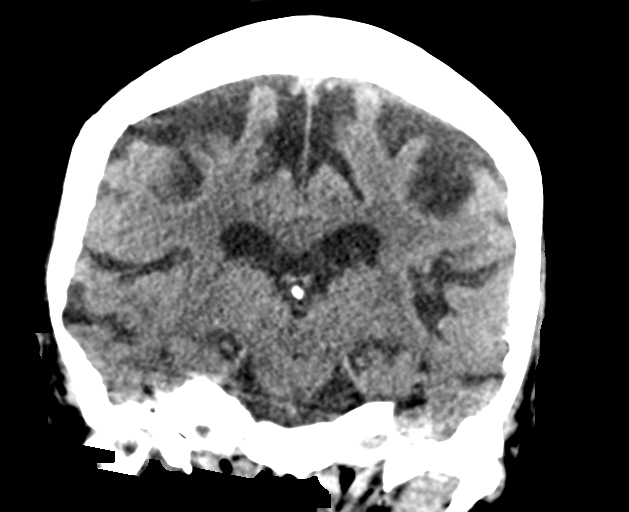
[im 35/63  brain]
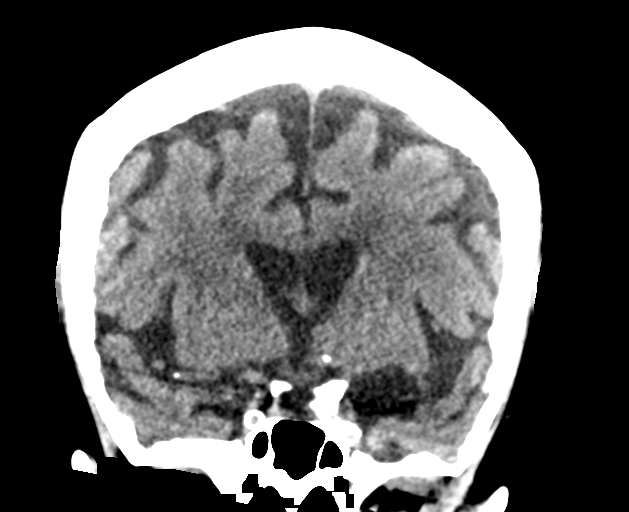

[Series 5: sagittal soft tissue · sagittal · 0.35mm/px · 3 of 55 slices shown]
[im 20/55  brain]
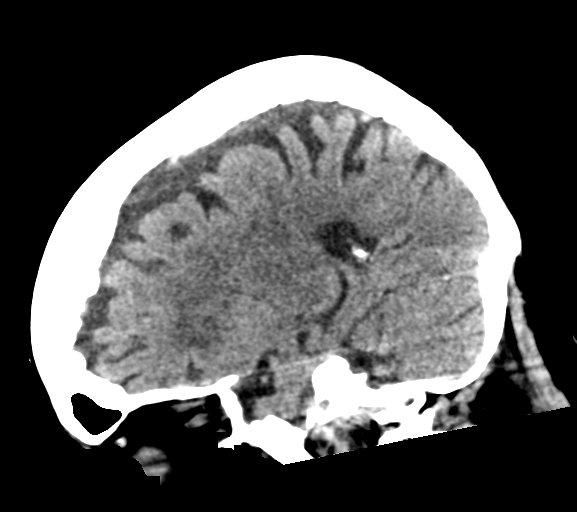
[im 28/55  brain]
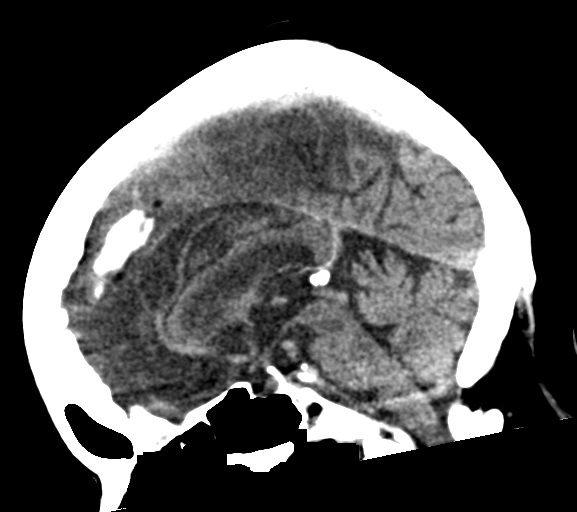
[im 35/55  brain]
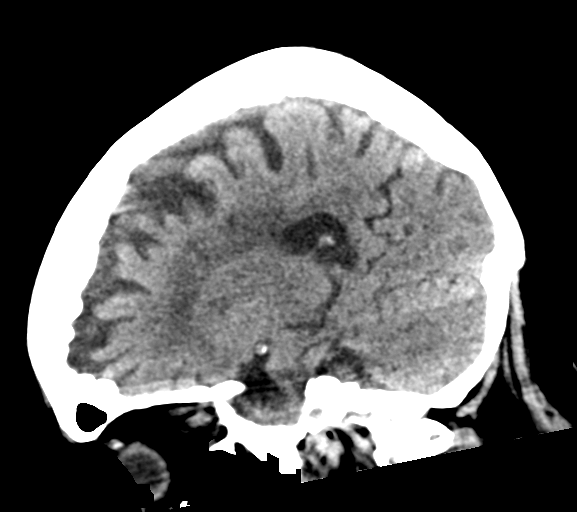

[Series 6: axial soft tissue · axial · 0.43mm/px · z∈[-170,-50]mm · 8 of 53 slices shown, 10 images]
[im 6/53  brain]
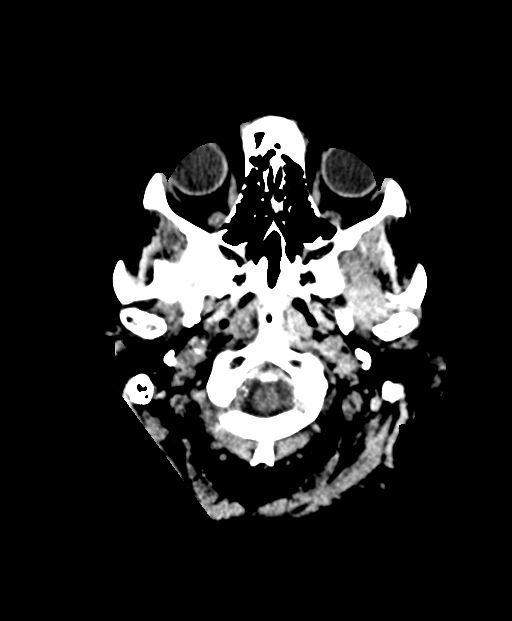
[im 6/53  bone]
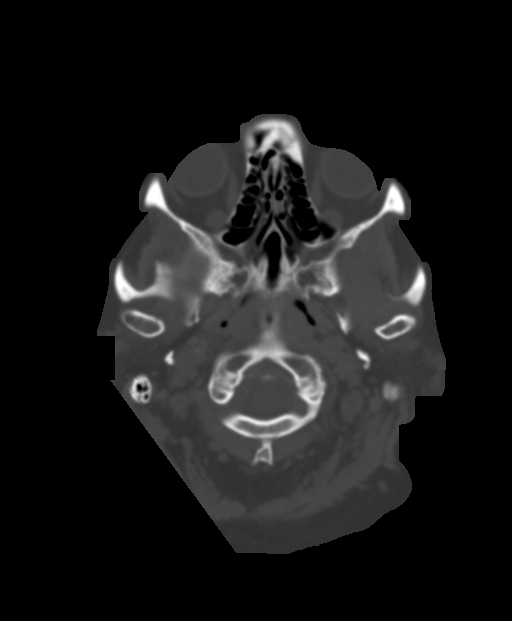
[im 12/53  brain]
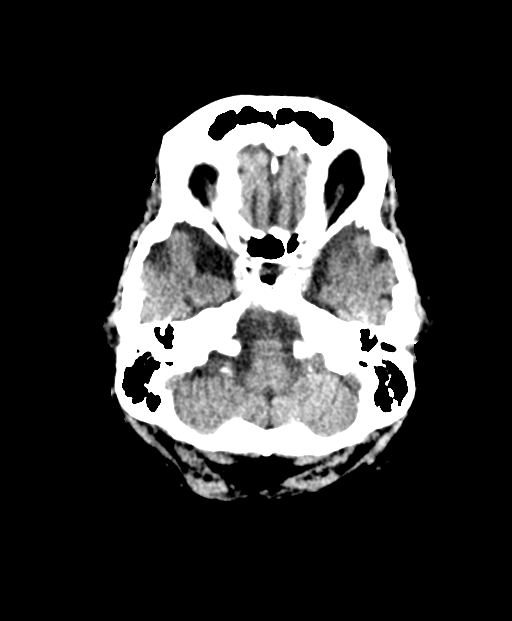
[im 18/53  brain]
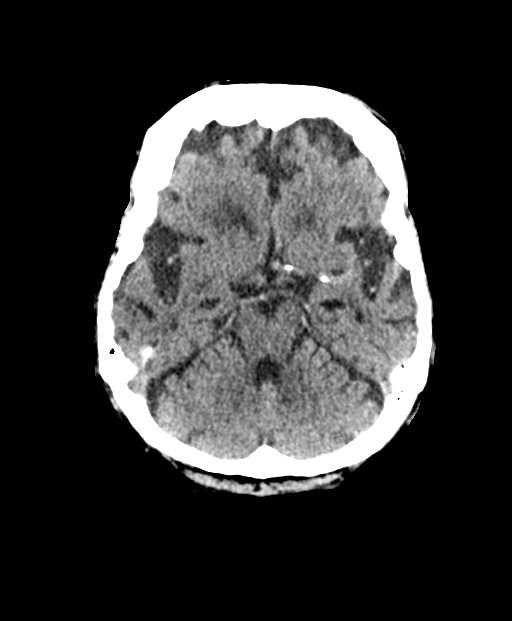
[im 24/53  brain]
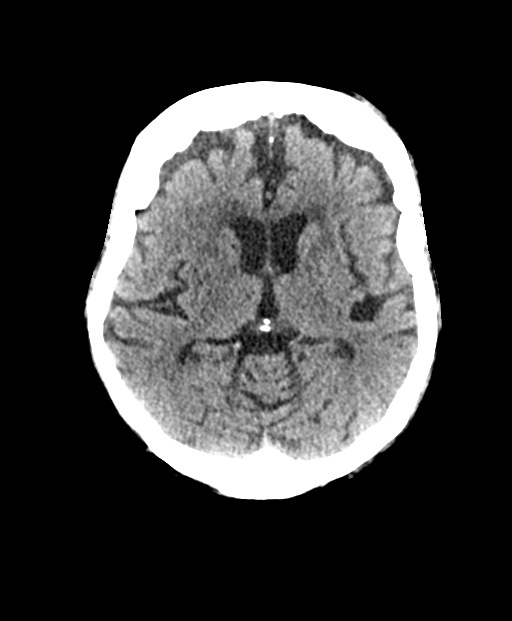
[im 29/53  brain]
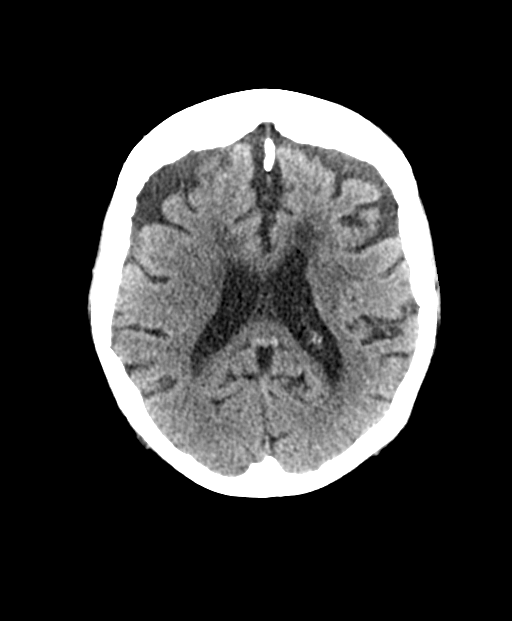
[im 29/53  bone]
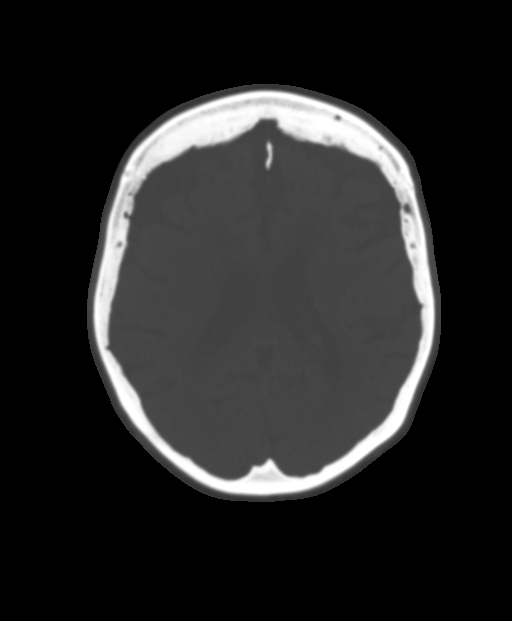
[im 35/53  brain]
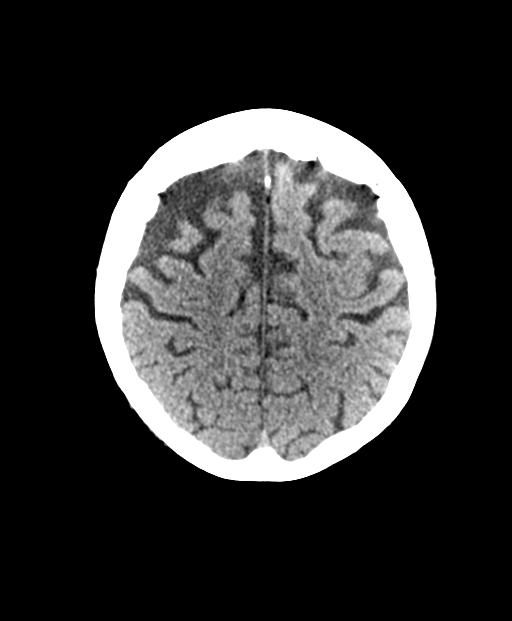
[im 41/53  brain]
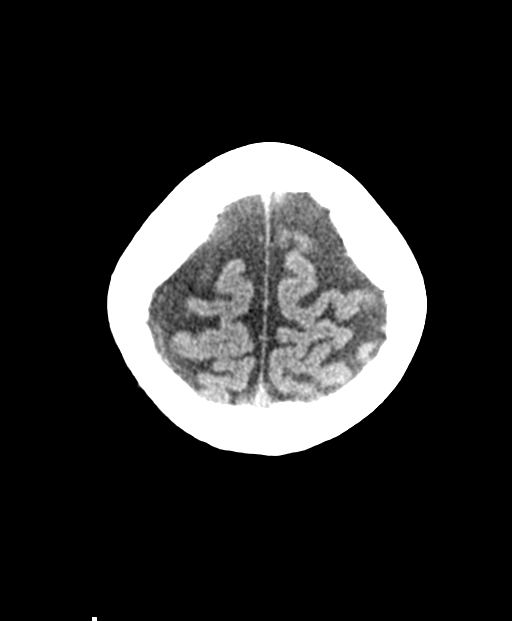
[im 47/53  brain]
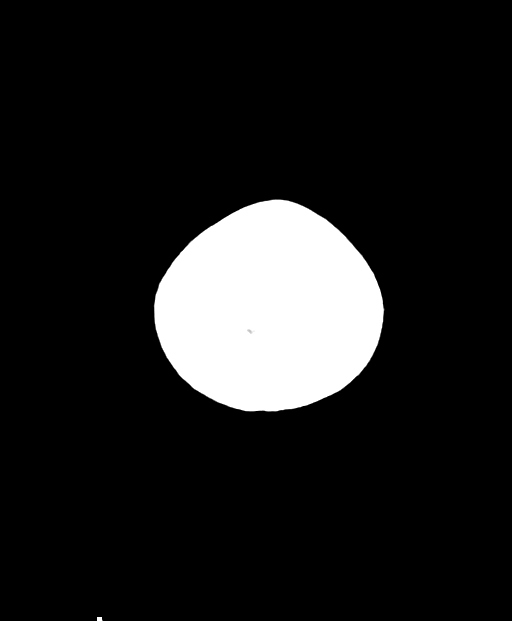

[14 of 47 positions shown; findings below may reference images not displayed]

FINDINGS: Brain: No evidence of acute infarction, hemorrhage, hydrocephalus,
extra-axial collection or mass lesion/mass effect. Stable advanced
chronic microvascular ischemic changes and parenchymal volume loss
of the brain. Stable chronic lacunar infarcts within left caudate
head, left lentiform nucleus, and left hemi pons.

Vascular: Severe calcific atherosclerosis of carotid siphons and
calcified plaques of the M1 segments. Probable persistent left
trigeminal artery.

Skull: Normal. Negative for fracture or focal lesion.The

Sinuses/Orbits: No acute finding.

Other: None.

ASPECTS ([facility] Stroke Program Early CT Score)

- Ganglionic level infarction (caudate, lentiform nuclei, internal
capsule, insula, M1-M3 cortex): 7

- Supraganglionic infarction (M4-M6 cortex): 3

Total score (0-10 with 10 being normal): 10
IMPRESSION: 1. No acute intracranial abnormality identified.
2. Stable advanced chronic microvascular ischemic changes and
parenchymal volume loss of the brain.
3. ASPECTS is 10
These results were called by telephone at the time of interpretation
on 07/07/2017 at [DATE] to Dr. Ronlor, who verbally acknowledged
these results.

By: Arda Minh M.D.

## 2018-07-15 IMAGING — DX DG ABD PORTABLE 1V
2 series · 2 of 2 positions shown · non-contrast
Comparison: CT 04/16/2006 .

CLINICAL DATA: Bowel obstruction.

EXAM:
PORTABLE ABDOMEN - 1 VIEW

[abdomen kub (1 of 2)]
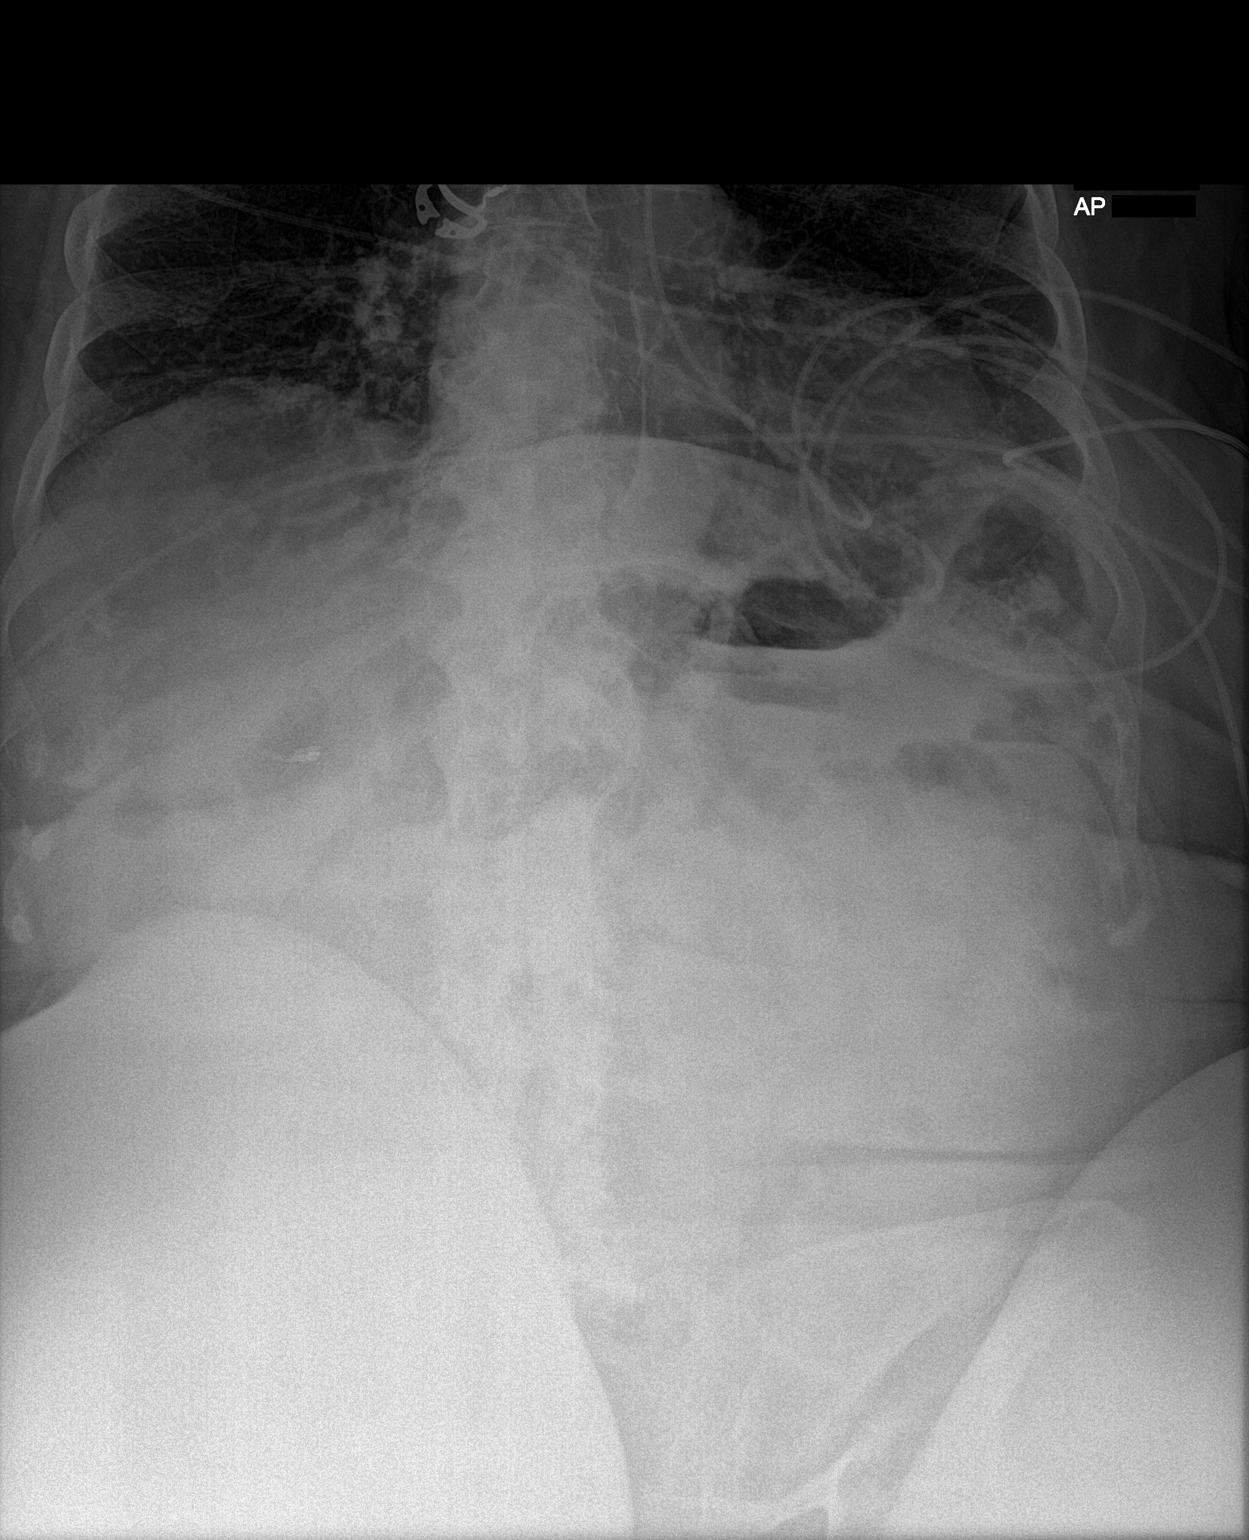

[abdomen kub (2 of 2)]
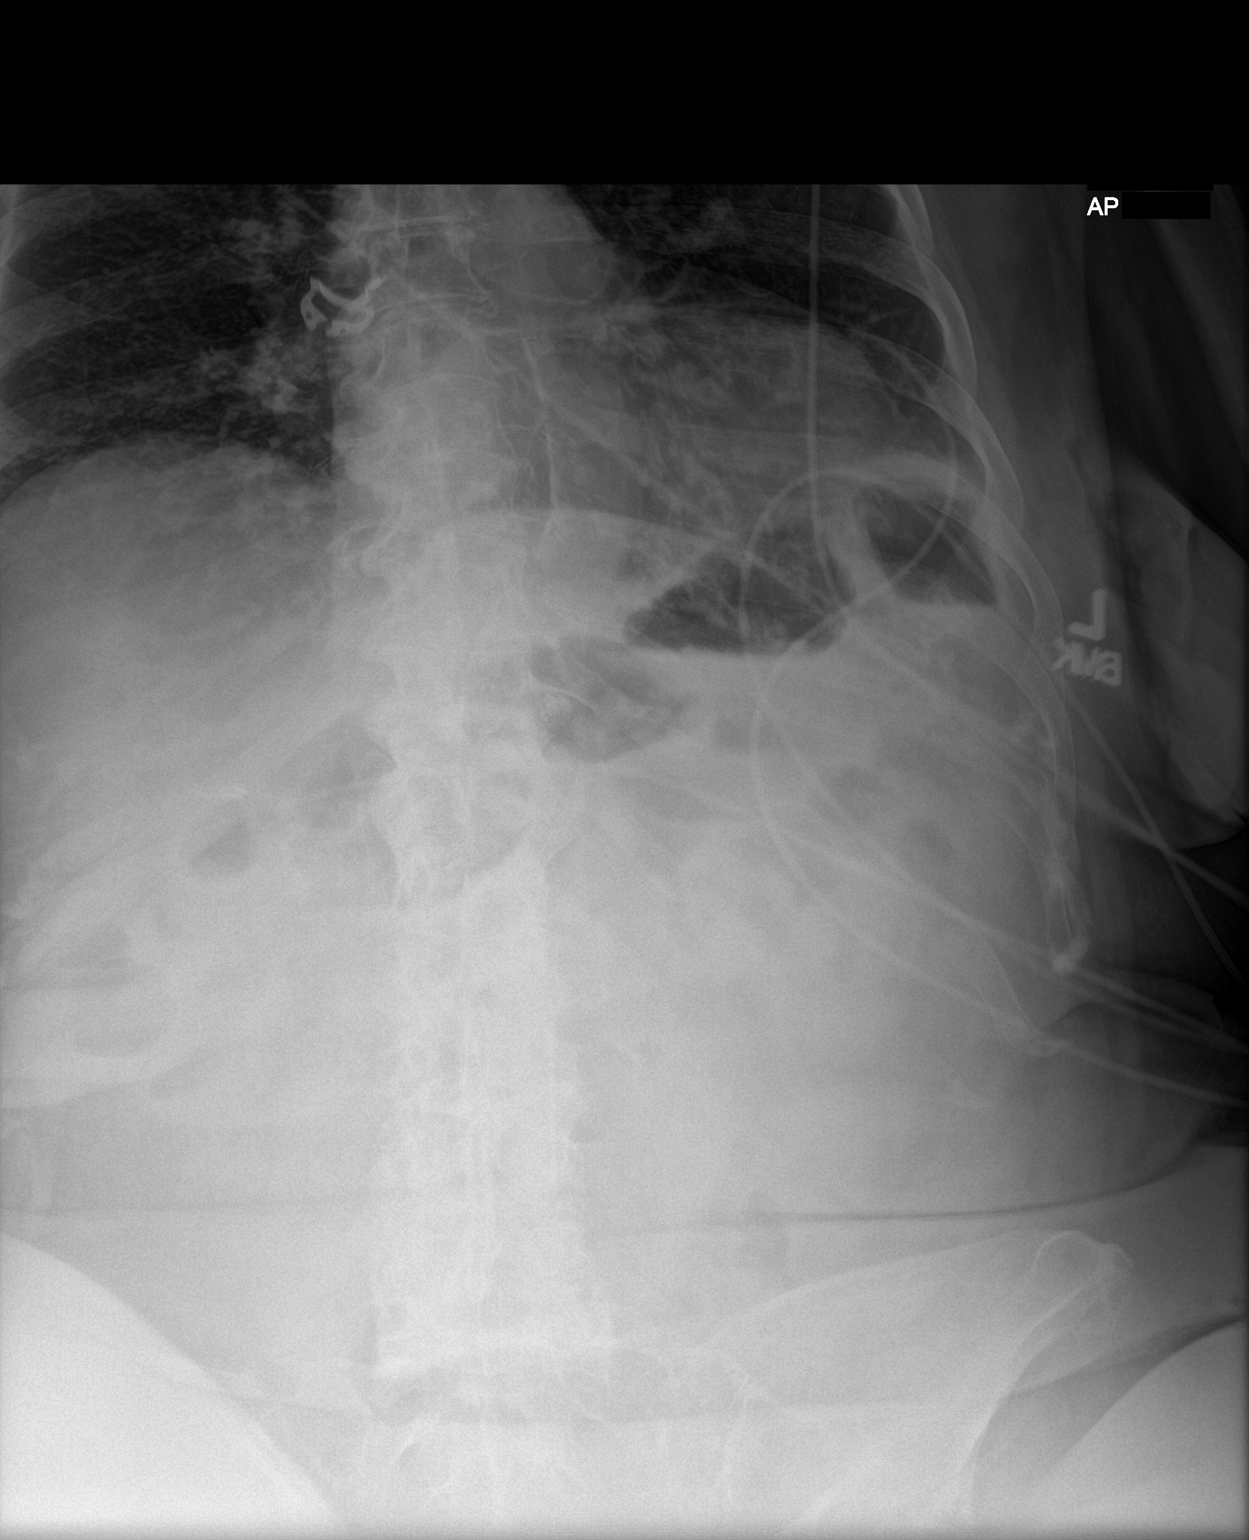

[2 of 2 positions shown; findings below may reference images not displayed]

FINDINGS: Surgical clips right upper quadrant. No evidence of bowel distention
or free air. Questionable small stone noted projected over the right
kidney. Aortic atherosclerosis. Degenerative changes thoracolumbar
spine. Lower pelvis not imaged.
IMPRESSION: 1.  No evidence of bowel distention or free air.

2.  Questionable small stone noted projected over the right kidney .
